# Patient Record
Sex: Male | Born: 2000
Health system: Southern US, Community
[De-identification: ages and names within clinical notes are randomized; demographics above are authoritative.]

## PROBLEM LIST (undated history)

## (undated) DIAGNOSIS — D571 Sickle-cell disease without crisis: Secondary | ICD-10-CM

---

## 2019-04-22 ENCOUNTER — Encounter (HOSPITAL_COMMUNITY): Payer: Self-pay | Admitting: General Practice

## 2019-04-22 ENCOUNTER — Non-Acute Institutional Stay (HOSPITAL_COMMUNITY)
Admission: AD | Admit: 2019-04-22 | Discharge: 2019-04-22 | Disposition: A | Discharge status: Home / Self Care | Payer: Self-pay | Source: Ambulatory Visit | Attending: Internal Medicine | Admitting: Internal Medicine

## 2019-04-22 ENCOUNTER — Telehealth (HOSPITAL_COMMUNITY): Payer: Self-pay | Admitting: *Deleted

## 2019-04-22 DIAGNOSIS — E860 Dehydration: Secondary | ICD-10-CM | POA: Insufficient documentation

## 2019-04-22 DIAGNOSIS — D5700 Hb-SS disease with crisis, unspecified: Secondary | ICD-10-CM | POA: Diagnosis present

## 2019-04-22 LAB — URINALYSIS, COMPLETE (UACMP) WITH MICROSCOPIC
Bacteria, UA: NONE SEEN
Bilirubin Urine: NEGATIVE
Glucose, UA: NEGATIVE mg/dL
Ketones, ur: 5 mg/dL — AB
Leukocytes,Ua: NEGATIVE
Nitrite: NEGATIVE
Protein, ur: NEGATIVE mg/dL
Specific Gravity, Urine: 1.004 — ABNORMAL LOW (ref 1.005–1.030)
pH: 6 (ref 5.0–8.0)

## 2019-04-22 LAB — CBC WITH DIFFERENTIAL/PLATELET
Abs Immature Granulocytes: 0.07 10*3/uL (ref 0.00–0.07)
Basophils Absolute: 0 10*3/uL (ref 0.0–0.1)
Basophils Relative: 0 %
Eosinophils Absolute: 0.1 10*3/uL (ref 0.0–0.5)
Eosinophils Relative: 1 %
HCT: 36.7 % — ABNORMAL LOW (ref 39.0–52.0)
Hemoglobin: 12.7 g/dL — ABNORMAL LOW (ref 13.0–17.0)
Immature Granulocytes: 1 %
Lymphocytes Relative: 6 %
Lymphs Abs: 0.9 10*3/uL (ref 0.7–4.0)
MCH: 26.1 pg (ref 26.0–34.0)
MCHC: 34.6 g/dL (ref 30.0–36.0)
MCV: 75.4 fL — ABNORMAL LOW (ref 80.0–100.0)
Monocytes Absolute: 1.8 10*3/uL — ABNORMAL HIGH (ref 0.1–1.0)
Monocytes Relative: 12 %
Neutro Abs: 12.7 10*3/uL — ABNORMAL HIGH (ref 1.7–7.7)
Neutrophils Relative %: 80 %
Platelets: 301 10*3/uL (ref 150–400)
RBC: 4.87 MIL/uL (ref 4.22–5.81)
RDW: 15 % (ref 11.5–15.5)
WBC: 15.6 10*3/uL — ABNORMAL HIGH (ref 4.0–10.5)
nRBC: 0.7 % — ABNORMAL HIGH (ref 0.0–0.2)

## 2019-04-22 LAB — COMPREHENSIVE METABOLIC PANEL
ALT: 13 U/L (ref 0–44)
AST: 22 U/L (ref 15–41)
Albumin: 4.7 g/dL (ref 3.5–5.0)
Alkaline Phosphatase: 71 U/L (ref 38–126)
Anion gap: 11 (ref 5–15)
BUN: 12 mg/dL (ref 6–20)
CO2: 22 mmol/L (ref 22–32)
Calcium: 9 mg/dL (ref 8.9–10.3)
Chloride: 100 mmol/L (ref 98–111)
Creatinine, Ser: 1.01 mg/dL (ref 0.61–1.24)
GFR calc Af Amer: >60 mL/min (ref >60)
GFR calc non Af Amer: >60 mL/min (ref >60)
Glucose, Bld: 86 mg/dL (ref 70–99)
Potassium: 3.7 mmol/L (ref 3.5–5.1)
Sodium: 133 mmol/L — ABNORMAL LOW (ref 135–145)
Total Bilirubin: 2.8 mg/dL — ABNORMAL HIGH (ref 0.3–1.2)
Total Protein: 7.6 g/dL (ref 6.5–8.1)

## 2019-04-22 LAB — RETICULOCYTES
Immature Retic Fract: 19.1 % — ABNORMAL HIGH (ref 2.3–15.9)
RBC.: 4.87 MIL/uL (ref 4.22–5.81)
Retic Count, Absolute: 160.2 10*3/uL (ref 19.0–186.0)
Retic Ct Pct: 3.3 % — ABNORMAL HIGH (ref 0.4–3.1)

## 2019-04-22 LAB — RAPID URINE DRUG SCREEN, HOSP PERFORMED
Amphetamines: NOT DETECTED
Barbiturates: NOT DETECTED
Benzodiazepines: NOT DETECTED
Cocaine: NOT DETECTED
Opiates: POSITIVE — AB
Tetrahydrocannabinol: POSITIVE — AB

## 2019-04-22 MED ORDER — KETOROLAC TROMETHAMINE 30 MG/ML IJ SOLN
15.0000 mg | Freq: Once | INTRAMUSCULAR | Status: AC
  Administered 2019-04-22: 15 mg via INTRAVENOUS
  Filled 2019-04-22: qty 1

## 2019-04-22 MED ORDER — HYDROMORPHONE HCL 1 MG/ML IJ SOLN
0.5000 mg | Freq: Once | INTRAMUSCULAR | Status: AC
  Administered 2019-04-22: 0.5 mg via INTRAVENOUS
  Filled 2019-04-22: qty 1

## 2019-04-22 MED ORDER — OXYCODONE HCL 5 MG PO TABS
5.0000 mg | ORAL_TABLET | Freq: Once | ORAL | Status: AC
  Administered 2019-04-22: 5 mg via ORAL
  Filled 2019-04-22: qty 1

## 2019-04-22 MED ORDER — SODIUM CHLORIDE 0.45 % IV BOLUS
500.0000 mL | Freq: Once | INTRAVENOUS | Status: AC
  Administered 2019-04-22: 500 mL via INTRAVENOUS

## 2019-04-22 MED ORDER — IBUPROFEN 800 MG PO TABS: 800.0000 mg | ORAL_TABLET | Freq: Three times a day (TID) | ORAL | 0 refills | Status: DC | PRN

## 2019-04-22 MED ORDER — ACETAMINOPHEN 500 MG PO TABS
1000.0000 mg | ORAL_TABLET | Freq: Once | ORAL | Status: AC
  Administered 2019-04-22: 1000 mg via ORAL
  Filled 2019-04-22: qty 2

## 2019-04-22 MED ORDER — ONDANSETRON HCL 4 MG/2ML IJ SOLN: 4.0000 mg | Freq: Once | INTRAMUSCULAR | Status: DC

## 2019-04-22 MED ORDER — FOLIC ACID 1 MG PO TABS: 1.0000 mg | ORAL_TABLET | Freq: Every day | ORAL | 11 refills | Status: DC

## 2019-04-22 MED ORDER — FOLIC ACID 1 MG PO TABS: 1.0000 mg | ORAL_TABLET | Freq: Every day | ORAL | Status: DC

## 2019-04-22 MED ORDER — DIPHENHYDRAMINE HCL 25 MG PO CAPS: 25.0000 mg | ORAL_CAPSULE | Freq: Once | ORAL | Status: DC

## 2019-04-22 NOTE — Telephone Encounter (Signed)
Provider, Genia Harold, from [facility] called requesting that patient come to the day hospital for pain management. Provider reports that patient is having sickle cell crisis in his left arm with pain rated 8/10. Reports that patient last took Tylenol with Codien at 6:30. COVID-19 screening done and negative. Denies fever, chest pain, nausea, vomiting, diarrhea, abdominal pain and priapism. Patient has transportation at discharge without driving self. [place], [place] notified and spoke with [place]. Patient can come to the day hospital for pain management. Provider and patient advised.

## 2019-04-22 NOTE — Discharge Instructions (Signed)
You will discharge with folic acid 1 mg daily for bone marrow support.  We discussed the importance of drinking 64 ounces of water daily.  Water to help prevent pain crises, is important to drink throughout today.  Dehydration of red blood cells can lead to rapid sickling. Sickle cell is primarily inflammatory.  I recommend that you continue ibuprofen 800 mg every 8 hours for mild to moderate pain.  Also, Tylenol 500 mg every 6 hours as needed.  Use interchangeably with prescribed pain medications.  I recommend that you follow-up with primary care provider. You will need maintenance of your sickle cell disease to prevent acute crisis.    Sickle Cell Anemia, Adult  Sickle cell anemia is a condition where your red blood cells are shaped like sickles. Red blood cells carry oxygen through the body. Sickle-shaped cells do not live as long as normal red blood cells. They also clump together and block blood from flowing through the blood vessels. This prevents the body from getting enough oxygen. Sickle cell anemia causes organ damage and pain. It also increases the risk of infection. Follow these instructions at home: Medicines  Take over-the-counter and prescription medicines only as told by your doctor.  If you were prescribed an antibiotic medicine, take it as told by your doctor. Do not stop taking the antibiotic even if you start to feel better.  If you develop a fever, do not take medicines to lower the fever right away. Tell your doctor about the fever. Managing pain, stiffness, and swelling  Try these methods to help with pain: ? Use a heating pad. ? Take a warm bath. ? Distract yourself, such as by watching TV. Eating and drinking  Drink enough fluid to keep your pee (urine) clear or pale yellow. Drink more in hot weather and during exercise.  Limit or avoid alcohol.  Eat a healthy diet. Eat plenty of fruits, vegetables, whole grains, and lean protein.  Take vitamins and supplements  as told by your doctor. Traveling  When traveling, keep these with you: ? Your medical information. ? The names of your doctors. ? Your medicines.  If you need to take an airplane, talk to your doctor first. Activity  Rest often.  Avoid exercises that make your heart beat much faster, such as jogging. General instructions  Do not use products that have nicotine or tobacco, such as cigarettes and e-cigarettes. If you need help quitting, ask your doctor.  Consider wearing a medical alert bracelet.  Avoid being in high places (high altitudes), such as mountains.  Avoid very hot or cold temperatures.  Avoid places where the temperature changes a lot.  Keep all follow-up visits as told by your doctor. This is important. Contact a doctor if:  A joint hurts.  Your feet or hands hurt or swell.  You feel tired (fatigued). Get help right away if:  You have symptoms of infection. These include: ? Fever. ? Chills. ? Being very tired. ? Irritability. ? Poor eating. ? Throwing up (vomiting).  You feel dizzy or faint.  You have new stomach pain, especially on the left side.  You have a an erection (priapism) that lasts more than 4 hours.  You have numbness in your arms or legs.  You have a hard time moving your arms or legs.  You have trouble talking.  You have pain that does not go away when you take medicine.  You are short of breath.  You are breathing fast.  You have  a long-term cough.  You have pain in your chest.  You have a bad headache.  You have a stiff neck.  Your stomach looks bloated even though you did not eat much.  Your skin is pale.  You suddenly cannot see well. Summary  Sickle cell anemia is a condition where your red blood cells are shaped like sickles.  Follow your doctor's advice on ways to manage pain, food to eat, activities to do, and steps to take for safe travel.  Get medical help right away if you have any signs of  infection, such as a fever. This information is not intended to replace advice given to you by your health care provider. Make sure you discuss any questions you have with your health care provider. Document Released: 05/05/2013 Document Revised: 11/06/2018 Document Reviewed: 08/20/2016 Elsevier Patient Education  2020 [employer].

## 2019-04-22 NOTE — Progress Notes (Signed)
Patient admitted to the day hospital for treatment of sickle cell pain crisis. Patient reported pain rated 8/10 in the left arm . Patient given IV  Dilaudid, PO Tylenol, IV Toradol and hydrated with IV fluids. At discharge patient reported pain at 3/10. Discharge instructions given to patient. Patient alert, oriented and ambulatory at discharge.

## 2019-04-22 NOTE — H&P (Signed)
[facility] History and Physical   Date: 04/22/2019  Patient name: Charles Mcgee Medical record number: [id number] Date of birth: 01/26/2001 Age: 18 y.o. Gender: male PCP: Quentin Angst, MD Would not do that when Attending physician: Quentin Angst, MD  Chief Complaint: Sickle cell pain  History of Present Illness: Gadge Hermiz, a 18 year old male with a medical history significant for sickle cell disease presents complaining of pain to left chest, left shoulder, left scapula, and left upper extremity.  Patient states that pain started suddenly 2 days ago.  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  He says that he typically does not have pain related to sickle cell disease.  When he does have pain, is usually managed with Percocet and/or ibuprofen.  Current pain intensity is 8/10 characterized as constant, throbbing, and occasionally sharp.  He attributes current pain crisis to dehydration and changes in weather.  He denies fever, chills, sore throat, persistent cough, chest pain, or shortness of breath.  No recent travel, no sick contacts, no exposure to COVID-19.  He also denies headache, dysuria, nausea, vomiting, or diarrhea  Meds: No medications prior to admission.    Allergies: Patient has no allergy information on record. No past medical history on file.  No family history on file. Social History   Socioeconomic History  . Marital status: Single    Spouse name: Not on file  . Number of children: Not on file  . Years of education: Not on file  . Highest education level: Not on file  Occupational History  . Not on file  Social Needs  . Financial resource strain: Not on file  . Food insecurity    Worry: Not on file    Inability: Not on file  . Transportation needs    Medical: Not on file    Non-medical: Not on file  Tobacco Use  . Smoking status: Not on file  Substance and Sexual Activity  . Alcohol use: Not on file  . Drug  use: Not on file  . Sexual activity: Not on file  Lifestyle  . Physical activity    Days per week: Not on file    Minutes per session: Not on file  . Stress: Not on file  Relationships  . Social Musician on phone: Not on file    Gets together: Not on file    Attends religious service: Not on file    Active member of club or organization: Not on file    Attends meetings of clubs or organizations: Not on file    Relationship status: Not on file  . Intimate partner violence    Fear of current or ex partner: Not on file    Emotionally abused: Not on file    Physically abused: Not on file    Forced sexual activity: Not on file  Other Topics Concern  . Not on file  Social History Narrative  . Not on file   Review of Systems  Constitutional: Negative.  Negative for chills and fever.  HENT: Negative.   Respiratory: Negative.   Cardiovascular: Negative.   Gastrointestinal: Negative.   Genitourinary: Negative.  Negative for dysuria and urgency.  Musculoskeletal: Positive for joint pain.  Skin: Negative.   Neurological: Negative.   Endo/Heme/Allergies: Negative.   Psychiatric/Behavioral: Negative.  Negative for depression. The patient is not nervous/anxious.     Physical Exam: Blood pressure (!) 144/78, pulse 72, temperature 99.6 F (37.6  C), temperature source Oral, height 6' (1.829 m), weight 167 lb (75.8 kg), SpO2 100 %. BP (!) 144/78 (BP Location: Right Arm)   Pulse 72   Temp 99.6 F (37.6 C) (Oral)   Ht 6' (1.829 m)   Wt 167 lb (75.8 kg)   SpO2 100%   BMI 22.65 kg/m   General Appearance:    Alert, cooperative, no distress, appears stated age  Head:    Normocephalic, without obvious abnormality, atraumatic  Eyes:    PERRL, conjunctiva/corneas clear, EOM's intact, fundi    benign, both eyes.  Scleral icterus.       Ears:    Normal TM's and external ear canals, both ears  Nose:   Nares normal, septum midline, mucosa normal, no drainage   or sinus  tenderness  Throat:   Lips, mucosa, and tongue normal; teeth and gums normal  Neck:   Supple, symmetrical, trachea midline, no adenopathy;       thyroid:  No enlargement/tenderness/nodules; no carotid   bruit or JVD  Back:     Symmetric, no curvature, ROM normal, no CVA tenderness  Lungs:     Clear to auscultation bilaterally, respirations unlabored  Chest wall:    No tenderness or deformity  Heart:    Regular rate and rhythm, S1 and S2 normal, no murmur, rub   or gallop  Abdomen:     Soft, non-tender, bowel sounds active all four quadrants,    no masses, no organomegaly  Extremities:   Extremities normal, atraumatic, no cyanosis or edema  Pulses:   2+ and symmetric all extremities  Skin:   Skin color, texture, turgor normal, no rashes or lesions  Lymph nodes:   Cervical, supraclavicular, and axillary nodes normal  Neurologic:   CNII-XII intact. Normal strength, sensation and reflexes      throughout    Lab results: No results found for this or any previous visit (from the past 24 hour(s)).  Imaging results:  No results found.   Assessment & Plan: Patient admitted to sickle cell day infusion clinic for management of pain crisis.  Patient is opiate nave. He is dehydrated and has very poor venous access. Patient warrants 0.45% saline fluid bolus, 500 mL over 1 hour for cellular rehydration. Dilaudid 0.5 mg IV x1 dose Oxycodone 5 mg IR by mouth x1 dose Toradol 15 mg IV x1 dose Tylenol 1000 mg by mouth x1 dose Zofran 4 mg IV x1 dose 25 mg by mouth x1 dose Pain will be reevaluated in context of functioning and patient should baseline as his care progresses If pain intensity remains elevated, transition to inpatient services for higher level of care  [place], MSN, FNP-C [facility] [address] [place], [place] [address] [phone]   04/22/2019, 3:27 PM

## 2019-04-22 NOTE — Discharge Summary (Signed)
[facility] Discharge Summary   Patient ID: Charles Mcgee MRN: [id number] DOB/AGE: [birth year] 18 y.o.  Admit date: 04/22/2019 Discharge date: 04/22/2019  Primary Care Physician:  Quentin Angst, MD  Admission Diagnoses:  Active Problems:   Sickle cell crisis [facility])   Discharge Diagnoses:   Sickle cell disease  Discharge Medications:  Allergies as of 04/22/2019   Not on File     Medication List    TAKE these medications   folic acid 1 MG tablet Commonly known as: FOLVITE Take 1 tablet (1 mg total) by mouth daily.   ibuprofen 800 MG tablet Commonly known as: ADVIL Take 1 tablet (800 mg total) by mouth every 8 (eight) hours as needed.        Consults:  None  Significant Diagnostic Studies:  No results found.   [facility] Course: Patient admitted to sickle cell day infusion center for management of pain crisis. Reviewed all laboratory values. WBCs 15.6, patient afebrile, no signs of infection or inflammation.  Suspected to be reactive. Hemoglobin 12.7.  Unsure of patient's baseline.  He has not been to a hematologist or PCP over the past several years. Pain managed with Dilaudid 0.5 mg IV x1 dose Oxycodone 5 mg by mouth x1 dose Tylenol 1000 mg by mouth x1 dose Toradol 15 mg IV x1 dose IV fluids, 500 mL 0.45% bolus Pain intensity decreased to 3/10.  Patient does not warrant admission at this time.  He was advised to return in a.m. if pain persists.  Ibuprofen 800 mg every 8 hours for mild to moderate pain.  Also, sent folic acid 1 mg daily for bone marrow support. Patient has opiate pain medications that were previously prescribed. He is alert, oriented, and ambulating without assistance. Patient will discharge home in a hemodynamically stable condition.  Discharge instructions: Resume all home medications.  Establish care with a primary care provider  Discussed the importance of drinking 64 ounces of water daily to  help  prevent pain crises, it is important to drink plenty of water throughout the day. This is because dehydration of red blood cells may lead further sickling.   Avoid all stressors that precipitate sickle cell pain crisis.     The patient was given clear instructions to go to ER or return to medical center if symptoms do not improve, worsen or new problems develop.    Physical Exam at Discharge:  BP 125/67 (BP Location: Right Arm)   Pulse 62   Temp 99.6 F (37.6 C) (Oral)   Resp 18   Ht 6' (1.829 m)   Wt 167 lb (75.8 kg)   SpO2 100%   BMI 22.65 kg/m  Physical Exam Constitutional:      Appearance: Normal appearance.  HENT:     Head: Normocephalic.  Eyes:     Pupils: Pupils are equal, round, and reactive to light.  Cardiovascular:     Rate and Rhythm: Normal rate and regular rhythm.  Pulmonary:     Effort: Pulmonary effort is normal.     Breath sounds: Normal breath sounds.  Abdominal:     General: Abdomen is flat. Bowel sounds are normal.  Musculoskeletal: Normal range of motion.  Neurological:     General: No focal deficit present.     Mental Status: He is alert. Mental status is at baseline.  Psychiatric:        Mood and Affect: Mood normal.        Behavior: Behavior normal.  Thought Content: Thought content normal.        Judgment: Judgment normal.      Disposition at Discharge: Discharge disposition: 01-Home or Self Care       Discharge Orders: Discharge Instructions    Discharge patient   Complete by: As directed    Discharge disposition: 01-Home or Self Care   Discharge patient date: 04/22/2019      Condition at Discharge:   Stable  Time spent on Discharge:  Greater than 30 minutes.  Signed: Donia Pounds  APRN, MSN, FNP-C Patient [facility] [address] [place], [place] [address] [phone]  04/22/2019, 4:53 PM

## 2019-04-23 ENCOUNTER — Encounter (HOSPITAL_COMMUNITY): Payer: Self-pay | Admitting: *Deleted

## 2019-04-23 ENCOUNTER — Non-Acute Institutional Stay (HOSPITAL_COMMUNITY)
Admission: AD | Admit: 2019-04-23 | Discharge: 2019-04-23 | Disposition: A | Discharge status: Home / Self Care | Payer: Self-pay | Source: Ambulatory Visit | Attending: Internal Medicine | Admitting: Internal Medicine

## 2019-04-23 ENCOUNTER — Telehealth (HOSPITAL_COMMUNITY): Payer: Self-pay | Admitting: General Practice

## 2019-04-23 DIAGNOSIS — Z79899 Other long term (current) drug therapy: Secondary | ICD-10-CM | POA: Insufficient documentation

## 2019-04-23 DIAGNOSIS — D5700 Hb-SS disease with crisis, unspecified: Secondary | ICD-10-CM | POA: Diagnosis present

## 2019-04-23 DIAGNOSIS — M25512 Pain in left shoulder: Secondary | ICD-10-CM | POA: Insufficient documentation

## 2019-04-23 DIAGNOSIS — K5900 Constipation, unspecified: Secondary | ICD-10-CM | POA: Insufficient documentation

## 2019-04-23 DIAGNOSIS — R079 Chest pain, unspecified: Secondary | ICD-10-CM | POA: Insufficient documentation

## 2019-04-23 DIAGNOSIS — M546 Pain in thoracic spine: Secondary | ICD-10-CM | POA: Insufficient documentation

## 2019-04-23 LAB — CBC
HCT: 36.8 % — ABNORMAL LOW (ref 39.0–52.0)
Hemoglobin: 12.9 g/dL — ABNORMAL LOW (ref 13.0–17.0)
MCH: 26.2 pg (ref 26.0–34.0)
MCHC: 35.1 g/dL (ref 30.0–36.0)
MCV: 74.8 fL — ABNORMAL LOW (ref 80.0–100.0)
Platelets: 254 10*3/uL (ref 150–400)
RBC: 4.92 MIL/uL (ref 4.22–5.81)
RDW: 14.8 % (ref 11.5–15.5)
WBC: 17.1 10*3/uL — ABNORMAL HIGH (ref 4.0–10.5)
nRBC: 0.5 % — ABNORMAL HIGH (ref 0.0–0.2)

## 2019-04-23 MED ORDER — DIPHENHYDRAMINE HCL 25 MG PO CAPS: 25.0000 mg | ORAL_CAPSULE | Freq: Four times a day (QID) | ORAL | Status: DC | PRN

## 2019-04-23 MED ORDER — ACETAMINOPHEN 500 MG PO TABS
1000.0000 mg | ORAL_TABLET | Freq: Once | ORAL | Status: AC
  Administered 2019-04-23: 1000 mg via ORAL
  Filled 2019-04-23: qty 2

## 2019-04-23 MED ORDER — ONDANSETRON HCL 4 MG/2ML IJ SOLN: 4.0000 mg | Freq: Four times a day (QID) | INTRAMUSCULAR | Status: DC | PRN

## 2019-04-23 MED ORDER — OXYCODONE HCL 5 MG PO TABS
5.0000 mg | ORAL_TABLET | Freq: Once | ORAL | Status: AC
  Administered 2019-04-23: 5 mg via ORAL
  Filled 2019-04-23: qty 1

## 2019-04-23 MED ORDER — KETOROLAC TROMETHAMINE 30 MG/ML IJ SOLN
15.0000 mg | Freq: Once | INTRAMUSCULAR | Status: AC
  Administered 2019-04-23: 15 mg via INTRAVENOUS
  Filled 2019-04-23: qty 1

## 2019-04-23 MED ORDER — SODIUM CHLORIDE 0.45 % IV SOLN
INTRAVENOUS | Status: DC
  Administered 2019-04-23: 13:00:00 via INTRAVENOUS

## 2019-04-23 NOTE — Discharge Summary (Signed)
[facility] Discharge Summary   Patient ID: Charles Mcgee MRN: [id number] DOB/AGE: October 24, 2000 18 y.o.  Admit date: 04/23/2019 Discharge date: 04/23/2019  Primary Care Physician:  Quentin Angst, MD  Admission Diagnoses:  Active Problems:   Sickle cell crisis [facility])    Discharge Medications:  Allergies as of 04/23/2019   Not on File     Medication List    TAKE these medications   folic acid 1 MG tablet Commonly known as: FOLVITE Take 1 tablet (1 mg total) by mouth daily.   ibuprofen 800 MG tablet Commonly known as: ADVIL Take 1 tablet (800 mg total) by mouth every 8 (eight) hours as needed.        Consults:  None  Significant Diagnostic Studies:  No results found.  History of present illness: Charles Mcgee, 18 year old male with a medical history significant for sickle cell disease presents complaining of pain primarily to left chest, left shoulder, and left upper back.  Patient states the pain intensity initially increased several days ago.  He attributes pain crisis to changes in weather.  Patient was treated for this problem and the sickle cell day infusion center on 04/22/2019 without complete resolution.  He states that he took hydromorphone and ibuprofen on last night without sustained relief.  Pain intensity is 7/10 characterized as constant, throbbing, and occasionally sharp.  He currently denies fever, chills, sore throat, persistent cough, chest pain, or shortness of breath.  No recent travel, sick contacts, or exposure to COVID-19.  He also denies headache, dysuria, nausea, vomiting, or diarrhea.  He endorses constipation and states that he took MiraLAX on last night. [facility] Course: Patient admitted to sickle cell day infusion center for management of pain crisis. Patient was treated and evaluated for this problem on 04/22/2019.  At that time, WBCs were elevated and continues to be elevated on today.  Patient remains afebrile  and there are no signs of infection or inflammation.  Suspected to be reactive due to sickle cell disease.  Patient does not warrant antibiotics. Pain managed with IV fluids, 0.45% saline at 125 mL/h IV Toradol 15 mg x 1  Tylenol 1000 mg by mouth x1 Oxycodone IR 5 mg by mouth x1 dose Patient's pain intensity decreased to 3/10.  He does not warrant admission at this time.  Patient advised to establish care with PCP for sickle cell and medication management.  He expressed understanding.  Patient is alert, oriented, and ambulating without assistance. He was discharged home in a hemodynamically stable condition.  Discharge instructions: Resume all home medications.  Establish care with PCP as discussed  Discussed the importance of drinking 64 ounces of water daily to  help prevent pain crises, it is important to drink plenty of water throughout the day. This is because dehydration of red blood cells may lead further sickling.   Avoid all stressors that precipitate sickle cell pain crisis.     The patient was given clear instructions to go to ER or return to medical center if symptoms do not improve, worsen or new problems develop.     Physical Exam at Discharge:  BP 135/76 (BP Location: Right Arm)   Pulse 64   Temp 98.3 F (36.8 C) (Oral)   Resp 16   SpO2 100%  Physical Exam Constitutional:      Appearance: Normal appearance.  Eyes:     Pupils: Pupils are equal, round, and reactive to light.  Cardiovascular:     Rate and  Rhythm: Normal rate and regular rhythm.  Pulmonary:     Effort: Pulmonary effort is normal.  Abdominal:     General: Abdomen is flat. Bowel sounds are normal.  Musculoskeletal: Normal range of motion.  Skin:    General: Skin is warm.  Neurological:     General: No focal deficit present.     Mental Status: He is alert.  Psychiatric:        Mood and Affect: Mood normal.        Behavior: Behavior normal.        Thought Content: Thought content normal.         Judgment: Judgment normal.       Disposition at Discharge: There are no questions and answers to display.        Discharge Orders:   Condition at Discharge:   Stable  Time spent on Discharge:  Greater than 30 minutes.  Signed: Donia Pounds  APRN, MSN, FNP-C Patient [facility] [address] [place], [place] [address] [phone]  04/23/2019, 4:36 PM

## 2019-04-23 NOTE — Telephone Encounter (Signed)
Patient called, complained of pain in the left arm rated at  6/10. Denied chest pain, fever, diarrhea, abdominal pain, nausea/vomitting. Screened negative for Covid-19 symptoms. Admitted to having means of transportation without driving self after treatment. Last took 600 mg of Ibuprofen at 03:00 today and Hydrocodone at 21:00 yesterday. Per provider, patient can come to the day hospital for treatment. Patient notified, verbalized understanding.

## 2019-04-23 NOTE — Progress Notes (Signed)
Patient admitted to the day infusion hospital for sickle cell pain. Initially, patient reported left arm pain rated 7/10. For pain management, patient given 15 mg IV Toradol, 1000 mg Tylenol, 5 mg Oxycodone and hydrated with IV fluids. K-pad also used on arm for pain relief. At discharge, patient rated pain at 3/10. Vital signs stable. Discharge instructions given. Patient alert, oriented and ambulatory at discharge.

## 2019-04-23 NOTE — Discharge Instructions (Signed)
Sickle Cell Anemia, Adult ° °Sickle cell anemia is a condition where your red blood cells are shaped like sickles. Red blood cells carry oxygen through the body. Sickle-shaped cells do not live as long as normal red blood cells. They also clump together and block blood from flowing through the blood vessels. This prevents the body from getting enough oxygen. Sickle cell anemia causes organ damage and pain. It also increases the risk of infection. °Follow these instructions at home: °Medicines °· Take over-the-counter and prescription medicines only as told by your doctor. °· If you were prescribed an antibiotic medicine, take it as told by your doctor. Do not stop taking the antibiotic even if you start to feel better. °· If you develop a fever, do not take medicines to lower the fever right away. Tell your doctor about the fever. °Managing pain, stiffness, and swelling °· Try these methods to help with pain: °? Use a heating pad. °? Take a warm bath. °? Distract yourself, such as by watching TV. °Eating and drinking °· Drink enough fluid to keep your pee (urine) clear or pale yellow. Drink more in hot weather and during exercise. °· Limit or avoid alcohol. °· Eat a healthy diet. Eat plenty of fruits, vegetables, whole grains, and lean protein. °· Take vitamins and supplements as told by your doctor. °Traveling °· When traveling, keep these with you: °? Your medical information. °? The names of your doctors. °? Your medicines. °· If you need to take an airplane, talk to your doctor first. °Activity °· Rest often. °· Avoid exercises that make your heart beat much faster, such as jogging. °General instructions °· Do not use products that have nicotine or tobacco, such as cigarettes and e-cigarettes. If you need help quitting, ask your doctor. °· Consider wearing a medical alert bracelet. °· Avoid being in high places (high altitudes), such as mountains. °· Avoid very hot or cold temperatures. °· Avoid places where the  temperature changes a lot. °· Keep all follow-up visits as told by your doctor. This is important. °Contact a doctor if: °· A joint hurts. °· Your feet or hands hurt or swell. °· You feel tired (fatigued). °Get help right away if: °· You have symptoms of infection. These include: °? Fever. °? Chills. °? Being very tired. °? Irritability. °? Poor eating. °? Throwing up (vomiting). °· You feel dizzy or faint. °· You have new stomach pain, especially on the left side. °· You have a an erection (priapism) that lasts more than 4 hours. °· You have numbness in your arms or legs. °· You have a hard time moving your arms or legs. °· You have trouble talking. °· You have pain that does not go away when you take medicine. °· You are short of breath. °· You are breathing fast. °· You have a long-term cough. °· You have pain in your chest. °· You have a bad headache. °· You have a stiff neck. °· Your stomach looks bloated even though you did not eat much. °· Your skin is pale. °· You suddenly cannot see well. °Summary °· Sickle cell anemia is a condition where your red blood cells are shaped like sickles. °· Follow your doctor's advice on ways to manage pain, food to eat, activities to do, and steps to take for safe travel. °· Get medical help right away if you have any signs of infection, such as a fever. °This information is not intended to replace advice given to you by   your health care provider. Make sure you discuss any questions you have with your health care provider. °Document Released: 05/05/2013 Document Revised: 11/06/2018 Document Reviewed: 08/20/2016 °Elsevier Patient Education © 2020 [employer] ° °

## 2019-04-23 NOTE — H&P (Addendum)
[facility] History and Physical   Date: 04/23/2019  Patient name: Charles Mcgee Medical record number: [id number] Date of birth: 05-May-2001 Age: 18 y.o. Gender: male PCP: Quentin Angst, MD  Attending physician: Quentin Angst, MD  Chief Complaint: Sickle cell pain  History of Present Illness: Charles Mcgee, a 18 year old male with a medical history significant for sickle cell disease presents complaining of pain primarily to left chest, left shoulder, and left upper back.  Patient states that pain intensity increased several days ago.  He attributes it to changes in weather.  Patient was treated for this problem in the sickle cell day infusion center on 04/22/2019 without complete resolution.  He states that he took hydromorphone and ibuprofen on last night without sustained relief.  Current pain intensity is 7/10 characterized as constant, throbbing, and occasionally sharp.  He currently denies fever, chills, sore throat, persistent cough, chest pain, or shortness of breath.  No recent travel, sick contacts, or exposure to COVID-19.  He also denies headache, dysuria, nausea, vomiting, or diarrhea.  He endorses constipation and states that he took MiraLAX on last night.  Meds: Medications Prior to Admission  Medication Sig Dispense Refill Last Dose  . folic acid (FOLVITE) 1 MG tablet Take 1 tablet (1 mg total) by mouth daily. 30 tablet 11   . ibuprofen (ADVIL) 800 MG tablet Take 1 tablet (800 mg total) by mouth every 8 (eight) hours as needed. 30 tablet 0     Allergies: Patient has no allergy information on record. No past medical history on file. No past surgical history on file. No family history on file. Social History   Socioeconomic History  . Marital status: Single    Spouse name: Not on file  . Number of children: Not on file  . Years of education: Not on file  . Highest education level: Not on file  Occupational History  . Not on file  Social  Needs  . Financial resource strain: Not on file  . Food insecurity    Worry: Not on file    Inability: Not on file  . Transportation needs    Medical: Not on file    Non-medical: Not on file  Tobacco Use  . Smoking status: Never Smoker  . Smokeless tobacco: Never Used  Substance and Sexual Activity  . Alcohol use: Not on file  . Drug use: Not on file  . Sexual activity: Not on file  Lifestyle  . Physical activity    Days per week: Not on file    Minutes per session: Not on file  . Stress: Not on file  Relationships  . Social Musician on phone: Not on file    Gets together: Not on file    Attends religious service: Not on file    Active member of club or organization: Not on file    Attends meetings of clubs or organizations: Not on file    Relationship status: Not on file  . Intimate partner violence    Fear of current or ex partner: Not on file    Emotionally abused: Not on file    Physically abused: Not on file    Forced sexual activity: Not on file  Other Topics Concern  . Not on file  Social History Narrative  . Not on file   Review of Systems  Constitutional: Negative for chills and fever.  HENT: Negative for hearing loss and tinnitus.   Respiratory: Negative  for cough and hemoptysis.   Gastrointestinal: Negative.   Genitourinary: Negative.   Musculoskeletal: Positive for back pain and joint pain.  Skin: Negative.   Neurological: Negative.   Endo/Heme/Allergies: Negative.   Psychiatric/Behavioral: Negative.     Physical Exam: There were no vitals taken for this visit. BP 135/76 (BP Location: Right Arm)   Pulse 64   Temp 98.3 F (36.8 C) (Oral)   Resp 16   SpO2 100%   General Appearance:    Alert, cooperative, no distress, appears stated age  Head:    Normocephalic, without obvious abnormality, atraumatic  Eyes:    PERRL, conjunctiva/corneas clear, EOM's intact, fundi    benign, both eyes       Ears:    Normal TM's and external ear  canals, both ears  Nose:   Nares normal, septum midline, mucosa normal, no drainage   or sinus tenderness  Throat:   Lips, mucosa, and tongue normal; teeth and gums normal  Neck:   Supple, symmetrical, trachea midline, no adenopathy;       thyroid:  No enlargement/tenderness/nodules; no carotid   bruit or JVD  Back:     Symmetric, no curvature, ROM normal, no CVA tenderness  Lungs:     Clear to auscultation bilaterally, respirations unlabored  Chest wall:    No tenderness or deformity  Heart:    Regular rate and rhythm, S1 and S2 normal, no murmur, rub   or gallop  Abdomen:     Soft, non-tender, bowel sounds active all four quadrants,    no masses, no organomegaly  Extremities:   Extremities normal, atraumatic, no cyanosis or edema  Pulses:   2+ and symmetric all extremities  Skin:   Skin color, texture, turgor normal, no rashes or lesions  Lymph nodes:   Cervical, supraclavicular, and axillary nodes normal  Neurologic:   CNII-XII intact. Normal strength, sensation and reflexes      throughout    Lab results: Results for orders placed or performed during the hospital encounter of 04/22/19 (from the past 24 hour(s))  CBC with Differential/Platelet     Status: Abnormal   Collection Time: 04/22/19  3:30 PM  Result Value Ref Range   WBC 15.6 (H) 4.0 - 10.5 K/uL   RBC 4.87 4.22 - 5.81 MIL/uL   Hemoglobin 12.7 (L) 13.0 - 17.0 g/dL   HCT 36.7 (L) 39.0 - 52.0 %   MCV 75.4 (L) 80.0 - 100.0 fL   MCH 26.1 26.0 - 34.0 pg   MCHC 34.6 30.0 - 36.0 g/dL   RDW 15.0 11.5 - 15.5 %   Platelets 301 150 - 400 K/uL   nRBC 0.7 (H) 0.0 - 0.2 %   Neutrophils Relative % 80 %   Neutro Abs 12.7 (H) 1.7 - 7.7 K/uL   Lymphocytes Relative 6 %   Lymphs Abs 0.9 0.7 - 4.0 K/uL   Monocytes Relative 12 %   Monocytes Absolute 1.8 (H) 0.1 - 1.0 K/uL   Eosinophils Relative 1 %   Eosinophils Absolute 0.1 0.0 - 0.5 K/uL   Basophils Relative 0 %   Basophils Absolute 0.0 0.0 - 0.1 K/uL   Immature Granulocytes 1 %    Abs Immature Granulocytes 0.07 0.00 - 0.07 K/uL  Comprehensive metabolic panel     Status: Abnormal   Collection Time: 04/22/19  3:30 PM  Result Value Ref Range   Sodium 133 (L) 135 - 145 mmol/L   Potassium 3.7 3.5 - 5.1 mmol/L   Chloride  100 98 - 111 mmol/L   CO2 22 22 - 32 mmol/L   Glucose, Bld 86 70 - 99 mg/dL   BUN 12 6 - 20 mg/dL   Creatinine, Ser 1.611.01 0.61 - 1.24 mg/dL   Calcium 9.0 8.9 - 09.610.3 mg/dL   Total Protein 7.6 6.5 - 8.1 g/dL   Albumin 4.7 3.5 - 5.0 g/dL   AST 22 15 - 41 U/L   ALT 13 0 - 44 U/L   Alkaline Phosphatase 71 38 - 126 U/L   Total Bilirubin 2.8 (H) 0.3 - 1.2 mg/dL   GFR calc non Af Amer >60 >60 mL/min   GFR calc Af Amer >60 >60 mL/min   Anion gap 11 5 - 15  Reticulocytes     Status: Abnormal   Collection Time: 04/22/19  3:30 PM  Result Value Ref Range   Retic Ct Pct 3.3 (H) 0.4 - 3.1 %   RBC. 4.87 4.22 - 5.81 MIL/uL   Retic Count, Absolute 160.2 19.0 - 186.0 K/uL   Immature Retic Fract 19.1 (H) 2.3 - 15.9 %  Rapid urine drug screen (hospital performed)     Status: Abnormal   Collection Time: 04/22/19  3:50 PM  Result Value Ref Range   Opiates POSITIVE (A) NONE DETECTED   Cocaine NONE DETECTED NONE DETECTED   Benzodiazepines NONE DETECTED NONE DETECTED   Amphetamines NONE DETECTED NONE DETECTED   Tetrahydrocannabinol POSITIVE (A) NONE DETECTED   Barbiturates NONE DETECTED NONE DETECTED  Urinalysis, Complete w Microscopic     Status: Abnormal   Collection Time: 04/22/19  3:50 PM  Result Value Ref Range   Color, Urine STRAW (A) YELLOW   APPearance CLEAR CLEAR   Specific Gravity, Urine 1.004 (L) 1.005 - 1.030   pH 6.0 5.0 - 8.0   Glucose, UA NEGATIVE NEGATIVE mg/dL   Hgb urine dipstick SMALL (A) NEGATIVE   Bilirubin Urine NEGATIVE NEGATIVE   Ketones, ur 5 (A) NEGATIVE mg/dL   Protein, ur NEGATIVE NEGATIVE mg/dL   Nitrite NEGATIVE NEGATIVE   Leukocytes,Ua NEGATIVE NEGATIVE   WBC, UA 0-5 0 - 5 WBC/hpf   Bacteria, UA NONE SEEN NONE SEEN     Imaging results:  No results found.   Assessment & Plan: Patient admitted to sickle cell day infusion center for management of pain crisis.  Patient mostly opiate nave. IV fluids, 0.45% saline at 125 mL/h Tylenol 1000 mg x 1 Benadryl 25 mg x 1 Toradol 15 mg IV x1 Zofran 4 mg IV x1 Oxycodone 5 mg p.o. x1 dose WBCs were elevated on 11/20/2018, repeat CBC.  All other laboratory values unremarkable and do not warrant repeating on today. Pain intensity will be reevaluated in context of functioning and relationship to baseline as his care progresses. If pain intensity remains elevated, consider transitioning to inpatient for higher level of care. Nolon NationsLachina Moore Hollis  APRN, MSN, FNP-C Patient Care [facility] [address]  [place], [place] [id number] [phone]  04/23/2019, 12:25 PM

## 2019-10-14 ENCOUNTER — Ambulatory Visit: Payer: Self-pay | Attending: Internal Medicine

## 2019-10-21 ENCOUNTER — Ambulatory Visit: Payer: Self-pay | Attending: Family

## 2019-10-21 DIAGNOSIS — Z23 Encounter for immunization: Secondary | ICD-10-CM

## 2019-10-21 NOTE — Progress Notes (Signed)
   Covid-19 Vaccination Clinic  Name:  Charles Mcgee    MRN: [id number] DOB: 05/27/2001  10/21/2019  Charles Mcgee was observed post Covid-19 immunization for 15 minutes without incident. He was provided with Vaccine Information Sheet and instruction to access the V-Safe system.   Charles Mcgee was instructed to call 911 with any severe reactions post vaccine: Marland Kitchen Difficulty breathing  . Swelling of face and throat  . A fast heartbeat  . A bad rash all over body  . Dizziness and weakness   Immunizations Administered    Name Date Dose VIS Date Route   Moderna COVID-19 Vaccine 10/21/2019 11:26 AM 0.5 mL 06/29/2019 Intramuscular   Manufacturer: Moderna   Lot: [id number]   NDC: 78478-412-82

## 2019-11-23 ENCOUNTER — Ambulatory Visit: Payer: Self-pay

## 2019-11-23 ENCOUNTER — Ambulatory Visit: Payer: Self-pay | Attending: Family

## 2019-11-23 DIAGNOSIS — Z23 Encounter for immunization: Secondary | ICD-10-CM

## 2019-11-23 NOTE — Progress Notes (Signed)
   Covid-19 Vaccination Clinic  Name:  Charles Mcgee    MRN: [id number] DOB: April 29, 2001  11/23/2019  Mr. Charles Mcgee was observed post Covid-19 immunization for 15 minutes without incident. He was provided with Vaccine Information Sheet and instruction to access the V-Safe system.   Mr. Charles Mcgee was instructed to call 911 with any severe reactions post vaccine: Marland Kitchen Difficulty breathing  . Swelling of face and throat  . A fast heartbeat  . A bad rash all over body  . Dizziness and weakness   Immunizations Administered    Name Date Dose VIS Date Route   Moderna COVID-19 Vaccine 11/23/2019  4:24 PM 0.5 mL 06/2019 Intramuscular   Manufacturer: Moderna   Lot: [id number]   NDC: 07218-288-33

## 2019-11-25 ENCOUNTER — Ambulatory Visit: Payer: Self-pay

## 2020-01-12 ENCOUNTER — Encounter (HOSPITAL_COMMUNITY): Payer: Self-pay

## 2020-01-12 ENCOUNTER — Emergency Department (HOSPITAL_COMMUNITY)
Admission: EM | Admit: 2020-01-12 | Discharge: 2020-01-13 | Disposition: A | Discharge status: Home / Self Care | Payer: Managed Care, Other (non HMO) | Attending: Emergency Medicine | Admitting: Emergency Medicine

## 2020-01-12 ENCOUNTER — Other Ambulatory Visit: Payer: Self-pay

## 2020-01-12 DIAGNOSIS — Z5321 Procedure and treatment not carried out due to patient leaving prior to being seen by health care provider: Secondary | ICD-10-CM | POA: Insufficient documentation

## 2020-01-12 DIAGNOSIS — R509 Fever, unspecified: Secondary | ICD-10-CM | POA: Insufficient documentation

## 2020-01-12 HISTORY — DX: Sickle-cell disease without crisis: D57.1

## 2020-01-12 NOTE — ED Triage Notes (Signed)
Arrived POV. Patient reports fever that started today. Patient reports Hx of sickle cell but not having pain today, but wants to get IV fluids before fever cause sickle cell crisis

## 2020-01-13 ENCOUNTER — Other Ambulatory Visit: Payer: Self-pay

## 2020-01-13 ENCOUNTER — Emergency Department (HOSPITAL_BASED_OUTPATIENT_CLINIC_OR_DEPARTMENT_OTHER)
Admission: EM | Admit: 2020-01-13 | Discharge: 2020-01-13 | Disposition: A | Discharge status: Home / Self Care | Payer: Managed Care, Other (non HMO) | Attending: Emergency Medicine | Admitting: Emergency Medicine

## 2020-01-13 ENCOUNTER — Encounter (HOSPITAL_BASED_OUTPATIENT_CLINIC_OR_DEPARTMENT_OTHER): Payer: Self-pay | Admitting: Emergency Medicine

## 2020-01-13 ENCOUNTER — Telehealth (HOSPITAL_COMMUNITY): Payer: Self-pay | Admitting: *Deleted

## 2020-01-13 ENCOUNTER — Emergency Department (HOSPITAL_BASED_OUTPATIENT_CLINIC_OR_DEPARTMENT_OTHER): Payer: Managed Care, Other (non HMO)

## 2020-01-13 DIAGNOSIS — D57219 Sickle-cell/Hb-C disease with crisis, unspecified: Secondary | ICD-10-CM | POA: Insufficient documentation

## 2020-01-13 DIAGNOSIS — R0981 Nasal congestion: Secondary | ICD-10-CM | POA: Insufficient documentation

## 2020-01-13 DIAGNOSIS — J029 Acute pharyngitis, unspecified: Secondary | ICD-10-CM

## 2020-01-13 DIAGNOSIS — R509 Fever, unspecified: Secondary | ICD-10-CM

## 2020-01-13 DIAGNOSIS — B9789 Other viral agents as the cause of diseases classified elsewhere: Secondary | ICD-10-CM | POA: Diagnosis not present

## 2020-01-13 DIAGNOSIS — Z20822 Contact with and (suspected) exposure to covid-19: Secondary | ICD-10-CM | POA: Diagnosis not present

## 2020-01-13 DIAGNOSIS — J028 Acute pharyngitis due to other specified organisms: Secondary | ICD-10-CM | POA: Insufficient documentation

## 2020-01-13 LAB — SARS CORONAVIRUS 2 BY RT PCR (HOSPITAL ORDER, PERFORMED IN ~~LOC~~ HOSPITAL LAB): SARS Coronavirus 2: NEGATIVE

## 2020-01-13 LAB — CBC WITH DIFFERENTIAL/PLATELET
Abs Immature Granulocytes: 0 10*3/uL (ref 0.00–0.07)
Basophils Absolute: 0.3 10*3/uL — ABNORMAL HIGH (ref 0.0–0.1)
Basophils Relative: 1 %
Eosinophils Absolute: 0 10*3/uL (ref 0.0–0.5)
Eosinophils Relative: 0 %
HCT: 35.9 % — ABNORMAL LOW (ref 39.0–52.0)
Hemoglobin: 12.6 g/dL — ABNORMAL LOW (ref 13.0–17.0)
Lymphocytes Relative: 5 %
Lymphs Abs: 1.4 10*3/uL (ref 0.7–4.0)
MCH: 25.9 pg — ABNORMAL LOW (ref 26.0–34.0)
MCHC: 35.1 g/dL (ref 30.0–36.0)
MCV: 73.7 fL — ABNORMAL LOW (ref 80.0–100.0)
Monocytes Absolute: 3.6 10*3/uL — ABNORMAL HIGH (ref 0.1–1.0)
Monocytes Relative: 13 %
Neutro Abs: 22.7 10*3/uL — ABNORMAL HIGH (ref 1.7–7.7)
Neutrophils Relative %: 81 %
Platelets: 250 10*3/uL (ref 150–400)
RBC: 4.87 MIL/uL (ref 4.22–5.81)
RDW: 15.3 % (ref 11.5–15.5)
WBC: 28 10*3/uL — ABNORMAL HIGH (ref 4.0–10.5)
nRBC: 0.2 % (ref 0.0–0.2)

## 2020-01-13 LAB — COMPREHENSIVE METABOLIC PANEL
ALT: 12 U/L (ref 0–44)
AST: 18 U/L (ref 15–41)
Albumin: 4.6 g/dL (ref 3.5–5.0)
Alkaline Phosphatase: 55 U/L (ref 38–126)
Anion gap: 12 (ref 5–15)
BUN: 14 mg/dL (ref 6–20)
CO2: 22 mmol/L (ref 22–32)
Calcium: 8.8 mg/dL — ABNORMAL LOW (ref 8.9–10.3)
Chloride: 101 mmol/L (ref 98–111)
Creatinine, Ser: 1.2 mg/dL (ref 0.61–1.24)
GFR calc Af Amer: >60 mL/min (ref >60)
GFR calc non Af Amer: >60 mL/min (ref >60)
Glucose, Bld: 103 mg/dL — ABNORMAL HIGH (ref 70–99)
Potassium: 3.3 mmol/L — ABNORMAL LOW (ref 3.5–5.1)
Sodium: 135 mmol/L (ref 135–145)
Total Bilirubin: 3.9 mg/dL — ABNORMAL HIGH (ref 0.3–1.2)
Total Protein: 7.7 g/dL (ref 6.5–8.1)

## 2020-01-13 LAB — URINALYSIS, ROUTINE W REFLEX MICROSCOPIC
Bilirubin Urine: NEGATIVE
Glucose, UA: NEGATIVE mg/dL
Hgb urine dipstick: NEGATIVE
Ketones, ur: 15 mg/dL — AB
Leukocytes,Ua: NEGATIVE
Nitrite: NEGATIVE
Protein, ur: NEGATIVE mg/dL
Specific Gravity, Urine: 1.015 (ref 1.005–1.030)
pH: 6 (ref 5.0–8.0)

## 2020-01-13 LAB — LACTIC ACID, PLASMA: Lactic Acid, Venous: 0.7 mmol/L (ref 0.5–1.9)

## 2020-01-13 LAB — RETICULOCYTES
Immature Retic Fract: 17.3 % — ABNORMAL HIGH (ref 2.3–15.9)
RBC.: 4.76 MIL/uL (ref 4.22–5.81)
Retic Count, Absolute: 132.3 10*3/uL (ref 19.0–186.0)
Retic Ct Pct: 2.8 % (ref 0.4–3.1)

## 2020-01-13 LAB — GROUP A STREP BY PCR: Group A Strep by PCR: NOT DETECTED

## 2020-01-13 MED ORDER — DIPHENHYDRAMINE HCL 25 MG PO CAPS
25.0000 mg | ORAL_CAPSULE | Freq: Once | ORAL | Status: AC
  Administered 2020-01-13: 25 mg via ORAL
  Filled 2020-01-13: qty 1

## 2020-01-13 MED ORDER — SODIUM CHLORIDE 0.9 % IV SOLN
2.0000 g | Freq: Once | INTRAVENOUS | Status: AC
  Administered 2020-01-13: 2 g via INTRAVENOUS
  Filled 2020-01-13: qty 20

## 2020-01-13 MED ORDER — KETOROLAC TROMETHAMINE 15 MG/ML IJ SOLN
15.0000 mg | INTRAMUSCULAR | Status: AC
  Administered 2020-01-13: 15 mg via INTRAVENOUS
  Filled 2020-01-13: qty 1

## 2020-01-13 MED ORDER — DEXTROSE-NACL 5-0.45 % IV SOLN: INTRAVENOUS | Status: DC

## 2020-01-13 MED ORDER — ACETAMINOPHEN 325 MG PO TABS
650.0000 mg | ORAL_TABLET | Freq: Once | ORAL | Status: AC | PRN
  Administered 2020-01-13: 650 mg via ORAL
  Filled 2020-01-13: qty 2

## 2020-01-13 NOTE — Progress Notes (Cosign Needed)
Charles Mcgee is a 19 year old male with a history of sickle cell disease. Patient presented with fever of unknown origin. He is asplenic. Patient's chest xray showed no acute cardiopulmonary. He is negative for COVID 19 infection. Patient was treated prophylactically with Rocephin 2 g, Tylenol, and IV fluids.  Agree with ER provider that patient is appropriate to be discharged home.  Patient will follow-up at [facility] health patient care center and sickle cell day infusion center on 01/14/2020 with this provider to repeat labs.   Charles Nations  APRN, MSN, FNP-C Patient Care [facility] [address] [place], [place] [address] [phone]

## 2020-01-13 NOTE — Discharge Instructions (Addendum)
Your strep test was negative today. Your chest x-ray was unremarkable.  The treatment for viral syndrome is Tylenol, ibuprofen and rest and plenty of fluids.  As we discussed, please avoid as much as you can contacting other people and wash your hands frequently.  Please use Tylenol or ibuprofen for pain.  You may use 600 mg ibuprofen every 6 hours or 1000 mg of Tylenol every 6 hours.  You may choose to alternate between the 2.  This would be most effective.  Not to exceed 4 g of Tylenol within 24 hours.  Not to exceed 3200 mg ibuprofen 24 hours.  Please follow-up tomorrow morning with Charles Mcgee and the sickle cell clinic.  I provided you with the address and phone number.  Please be there at 8 AM.  Viral Illness TREATMENT  Treatment is directed at relieving symptoms. There is no cure. Antibiotics are not effective, because the infection is caused by a virus, not by bacteria. Treatment may include:  Increased fluid intake. Sports drinks offer valuable electrolytes, sugars, and fluids.  Breathing heated mist or steam (vaporizer or shower).  Eating chicken soup or other clear broths, and maintaining good nutrition.  Getting plenty of rest.  Using gargles or lozenges for comfort.  Increasing usage of your inhaler if you have asthma.  Return to work when your temperature has returned to normal.  Gargle warm salt water and spit it out for sore throat. Take benadryl to decrease sinus secretions. Continue to alternate between Tylenol and ibuprofen for pain and fever control.  Follow Up: Follow up with your primary care doctor in 5-7 days for recheck of ongoing symptoms.  Return to emergency department for emergent changing or worsening of symptoms.

## 2020-01-13 NOTE — Telephone Encounter (Signed)
Patient called requesting to come to the day hospital for fever. Patient reports that he is not having a sickle cell pain crisis but he has been having a persistent fever with sore throat and sinus congestion. Patient reports that he had a COVID quick test which was negative. Reports that he went to the ED yesterday but left due to long waiting time. This RN explained to the patient that the day hospital is for acute pain crisis and that we do not do workup for fever. 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 Patient advised and expresses an understanding.

## 2020-01-13 NOTE — ED Triage Notes (Signed)
Fever and sore throat since yesterday, concerned because he has a hx of sickle cell.

## 2020-01-13 NOTE — ED Provider Notes (Addendum)
[facility] EMERGENCY DEPARTMENT Provider Note   CSN: [id number] Arrival date & time: 01/13/20  1018     History Chief Complaint  Patient presents with  . Fever  . Sore Throat    Charles Mcgee is a 19 y.o. male.  HPI Patient is a 19 year old male with a history of sickle cell presenting today with sore throat since yesterday.  He states that he has had mild fever and some chills, no nausea, vomiting or diarrhea.  He states he has had no sickle cell pain crisis symptoms.  Denies any cough, hemoptysis, lightheadedness or dizziness.  He states he has had some very mild congestion.  He states he has had a history of mono as a child denies any sick contacts with negative mono recently.  He is a significant other who he states is asymptomatic.  He states he recently traveled to [place] with some friends and share drinks may have been something from them.  He states that he took Tylenol 2 tablets yesterday evening but is taken no other medications since.    Past Medical History:  Diagnosis Date  . Sickle cell anemia [facility])     Patient Active Problem List   Diagnosis Date Noted  . Sickle cell crisis (HCC) 04/22/2019    History reviewed. No pertinent surgical history.     No family history on file.  Social History   Tobacco Use  . Smoking status: Never Smoker  . Smokeless tobacco: Never Used  Substance Use Topics  . Alcohol use: Yes  . Drug use: Never    Home Medications Prior to Admission medications   Medication Sig Start Date End Date Taking? Authorizing Provider  folic acid (FOLVITE) 1 MG tablet Take 1 tablet (1 mg total) by mouth daily. 04/22/19   Massie Maroon, FNP  ibuprofen (ADVIL) 800 MG tablet Take 1 tablet (800 mg total) by mouth every 8 (eight) hours as needed. 04/22/19   Massie Maroon, FNP    Allergies    Amoxicillin  Review of Systems   Review of Systems  Constitutional: Negative for chills and fever.  HENT: Positive for sore  throat. Negative for congestion.   Respiratory: Negative for cough and shortness of breath.   Cardiovascular: Negative for chest pain.  Gastrointestinal: Negative for abdominal pain.  Musculoskeletal: Negative for neck pain.    Physical Exam Updated Vital Signs BP 116/74 Comment: rm air  Pulse 72 Comment: rm air  Temp 100.3 F (37.9 C) (Oral)   Resp 20 Comment: rm air  Ht 6' (1.829 m)   Wt 79.4 kg   SpO2 98% Comment: rm air  BMI 23.73 kg/m   Physical Exam Vitals and nursing note reviewed.  Constitutional:      General: He is not in acute distress. HENT:     Head: Normocephalic and atraumatic.     Nose: Nose normal.     Mouth/Throat:     Mouth: Mucous membranes are moist.     Tonsils: Tonsillar exudate present. 1+ on the right. 1+ on the left.  Eyes:     General: No scleral icterus. Neck:     Comments: No neck stiffness, neck with full range of motion. Scant tonsillar exudate bilaterally 1+ bilateral tonsils.  Mild posterior pharynx erythema.  Normal phonation, normal swallowing, mild bilateral cervical lymphadenopathy. Cardiovascular:     Rate and Rhythm: Normal rate and regular rhythm.     Pulses: Normal pulses.     Heart sounds: Normal heart  sounds.  Pulmonary:     Effort: Pulmonary effort is normal. No respiratory distress.     Breath sounds: No wheezing.  Abdominal:     Palpations: Abdomen is soft.     Tenderness: There is no abdominal tenderness. There is no guarding or rebound.     Comments: No abdominal tenderness.  Musculoskeletal:     Cervical back: Normal range of motion.     Right lower leg: No edema.     Left lower leg: No edema.  Skin:    General: Skin is warm and dry.     Capillary Refill: Capillary refill takes less than 2 seconds.  Neurological:     Mental Status: He is alert. Mental status is at baseline.  Psychiatric:        Mood and Affect: Mood normal.        Behavior: Behavior normal.     ED Results / Procedures / Treatments    Labs (all labs ordered are listed, but only abnormal results are displayed) Labs Reviewed  CBC WITH DIFFERENTIAL/PLATELET - Abnormal; Notable for the following components:      Result Value   WBC 28.0 (*)    Hemoglobin 12.6 (*)    HCT 35.9 (*)    MCV 73.7 (*)    MCH 25.9 (*)    Neutro Abs 22.7 (*)    Monocytes Absolute 3.6 (*)    Basophils Absolute 0.3 (*)    All other components within normal limits  COMPREHENSIVE METABOLIC PANEL - Abnormal; Notable for the following components:   Potassium 3.3 (*)    Glucose, Bld 103 (*)    Calcium 8.8 (*)    Total Bilirubin 3.9 (*)    All other components within normal limits  RETICULOCYTES - Abnormal; Notable for the following components:   Immature Retic Fract 17.3 (*)    All other components within normal limits  URINALYSIS, ROUTINE W REFLEX MICROSCOPIC - Abnormal; Notable for the following components:   Color, Urine AMBER (*)    Ketones, ur 15 (*)    All other components within normal limits  GROUP A STREP BY PCR  SARS CORONAVIRUS 2 BY RT PCR (HOSPITAL ORDER, PERFORMED IN [facility])  CULTURE, BLOOD (ROUTINE X 2)  CULTURE, BLOOD (ROUTINE X 2)  URINE CULTURE  LACTIC ACID, PLASMA  PATHOLOGIST SMEAR REVIEW    EKG None  Radiology DG Chest Portable 1 View  Result Date: 01/13/2020 CLINICAL DATA:  Cough and fever EXAM: PORTABLE CHEST 1 VIEW COMPARISON:  None. FINDINGS: Lungs are clear. Heart size and pulmonary vascularity are normal. No adenopathy. No bone lesions. IMPRESSION: No abnormality noted. Electronically Signed   By: Bretta Bang III M.D.   On: 01/13/2020 12:19    Procedures .Critical Care Performed by: Gailen Shelter, PA Authorized by: Gailen Shelter, PA   Critical care provider statement:    Critical care time (minutes):  35   Critical care time was exclusive of:  Separately billable procedures and treating other patients and teaching time   Critical care was necessary to treat or prevent  imminent or life-threatening deterioration of the following conditions: fever in sickle cell patient.   Critical care was time spent personally by me on the following activities:  Discussions with consultants, evaluation of patient's response to treatment, examination of patient, review of old charts, re-evaluation of patient's condition, pulse oximetry, ordering and review of radiographic studies, ordering and review of laboratory studies and ordering and performing treatments and  interventions   I assumed direction of critical care for this patient from another provider in my specialty: no     (including critical care time)  Medications Ordered in ED Medications  dextrose 5 %-0.45 % sodium chloride infusion ( Intravenous New Bag/Given 01/13/20 1315)  acetaminophen (TYLENOL) tablet 650 mg (650 mg Oral Given 01/13/20 1042)  cefTRIAXone (ROCEPHIN) 2 g in sodium chloride 0.9 % 100 mL IVPB (0 g Intravenous Stopped 01/13/20 1419)  diphenhydrAMINE (BENADRYL) capsule 25 mg (25 mg Oral Given 01/13/20 1315)  ketorolac (TORADOL) 15 MG/ML injection 15 mg (15 mg Intravenous Given 01/13/20 1315)    ED Course  I have reviewed the triage vital signs and the nursing notes.  Pertinent labs & imaging results that were available during my care of the patient were reviewed by me and considered in my medical decision making (see chart for details).  Pt with SSD is in today with 1 day of sore throat.  Is well-appearing in general.  Vital signs mildly deviated -- likely his fever given that his tachycardia and hypertension corrected after antipyretics.  Some suspicion for strep versus mono.  Most likely is a viral illness. I discussed this case with my attending physician Dr. Gareth Morgan.  Will provide patient with 2 g of Rocephin as prophylactic antibiotics, obtain labs, reticulocytes, Covid, mono, rapid strep, blood cultures and provide patient with some IV fluids.   Physical exam is notable for tonsillar  exudates bilaterally, tonsillar hypertrophy and cervical lymphadenopathy.   Clinical Course as of Jan 13 1515  Thu Jan 13, 2020  1227 Chest x-ray is without any infiltrate or acute abnormality.   [WF]  1227 Group A strep negative.   [WF]  1227 Patient is tolerating p.o.  Well-appearing.  Vital signs within normal limits at this time.  Mild temperature elevation still, will recommend regular Tylenol ibuprofen use.   [WF]    Clinical Course User Index [WF] Tedd Sias, [place]   Patient CMP is relatively unremarkable.  Bilirubin is mildly elevated is consistent with his normal bilirubin.  Potassium is mildly low at 3.3 I gave dietary recommendations the patient.  Lactic acid within normal limits, group A strep and mono and Covid negative.  Urinalysis for nitrites, leukocytes, hemoglobin.  There are some ketones present likely secondary to very little p.o. intake today.  No evidence of infection.  Blood culture and urine cultures pending.  CBC with significant leukocytosis of 28 with no significant anemia.  Discussed case with [facility] S[facility]le sickle cell clinic.  She will see patient tomorrow morning in clinic and redraw labs.  Patient is well-appearing on reevaluation.  Understands results of all of his tests.  Was given strict return precautions.  He is tolerating p.o. and agreeable to discharge this time he agrees to follow-up at 8 AM tomorrow at the sickle cell clinic.  MDM Rules/Calculators/A&P                           Charles Mcgee was evaluated in Emergency Department on 01/13/2020 for the symptoms described in the history of present illness. He was evaluated in the context of the global COVID-19 pandemic, which necessitated consideration that the patient might be at risk for infection with the SARS-CoV-2 virus that causes COVID-19. Institutional protocols and algorithms that pertain to the evaluation of patients at risk for COVID-19 are in a state of rapid change based on information  released by regulatory bodies including the CDC  and federal and state organizations. These policies and algorithms were followed during the patient's care in the ED.  I discussed this case with my attending physician who cosigned this note including patient's presenting symptoms, physical exam, and planned diagnostics and interventions. Attending physician stated agreement with plan or made changes to plan which were implemented.   Attending physician assessed patient at bedside.  Final Clinical Impression(s) / ED Diagnoses Final diagnoses:  Viral pharyngitis  Fever, unspecified fever cause    Rx / DC Orders ED Discharge Orders    None       Gailen Shelter, [place] 01/13/20 1516    Gailen Shelter, [place] 01/13/20 1516    Alvira Monday, MD 01/14/20 2159

## 2020-01-14 ENCOUNTER — Telehealth: Payer: Self-pay

## 2020-01-14 ENCOUNTER — Ambulatory Visit (HOSPITAL_COMMUNITY)
Admission: RE | Admit: 2020-01-14 | Discharge: 2020-01-14 | Disposition: A | Discharge status: Home / Self Care | Payer: Managed Care, Other (non HMO) | Source: Ambulatory Visit | Attending: Internal Medicine | Admitting: Internal Medicine

## 2020-01-14 DIAGNOSIS — D571 Sickle-cell disease without crisis: Secondary | ICD-10-CM | POA: Diagnosis not present

## 2020-01-14 DIAGNOSIS — D72829 Elevated white blood cell count, unspecified: Secondary | ICD-10-CM | POA: Diagnosis present

## 2020-01-14 LAB — URINE CULTURE: Culture: NO GROWTH

## 2020-01-14 LAB — CBC
HCT: 37.8 % — ABNORMAL LOW (ref 39.0–52.0)
Hemoglobin: 13.4 g/dL (ref 13.0–17.0)
MCH: 26.5 pg (ref 26.0–34.0)
MCHC: 35.4 g/dL (ref 30.0–36.0)
MCV: 74.7 fL — ABNORMAL LOW (ref 80.0–100.0)
Platelets: 206 10*3/uL (ref 150–400)
RBC: 5.06 MIL/uL (ref 4.22–5.81)
RDW: 15.1 % (ref 11.5–15.5)
WBC: 19.5 10*3/uL — ABNORMAL HIGH (ref 4.0–10.5)
nRBC: 0.1 % (ref 0.0–0.2)

## 2020-01-14 LAB — PATHOLOGIST SMEAR REVIEW

## 2020-01-14 NOTE — Progress Notes (Signed)
[facility] came [facility] the day [facility] for follow up. Patient seen at [facility] yesterday for fever. Per patient, he was given fluids and advised to take Tylenol q6 hours for fever. Patient reports feeling better today and denies having fever. Also denies pain. [place], FNP placed order to obtain CBC due to previous elevated WBC count. CBC drawn and patient tolerated well. Provider will follow up with patient concerning lab results. Patient alert, oriented and ambulatory at discharge.

## 2020-01-14 NOTE — Telephone Encounter (Signed)
-----   Message from Massie Maroon, [place] sent at 01/14/2020 11:36 AM EDT ----- Regarding: lab results Please inform patient that his WBC count has improved to 19.5. Continue with Tylenol 650 mg every 6 hours as needed for fever. Will follow up in office at scheduled appointment.    Nolon Nations  APRN, MSN, FNP-C Patient Care [facility] [address] [place], [place] [address] [phone]

## 2020-01-14 NOTE — Telephone Encounter (Signed)
Called and spoke with patient about his lab results, informed him that wbc is stable at , keeping Tylenol 650mg  for fever. Follow up in 1 month .

## 2020-01-18 LAB — CULTURE, BLOOD (ROUTINE X 2)
Culture: NO GROWTH
Culture: NO GROWTH
Special Requests: ADEQUATE

## 2020-02-22 ENCOUNTER — Ambulatory Visit (INDEPENDENT_AMBULATORY_CARE_PROVIDER_SITE_OTHER): Payer: Managed Care, Other (non HMO) | Admitting: Family Medicine

## 2020-02-22 ENCOUNTER — Encounter: Payer: Self-pay | Admitting: Family Medicine

## 2020-02-22 ENCOUNTER — Other Ambulatory Visit: Payer: Self-pay

## 2020-02-22 VITALS — BP 107/74 | HR 67 | Temp 98.6°F | Resp 16 | Ht 72.0 in | Wt 174.0 lb

## 2020-02-22 DIAGNOSIS — D5720 Sickle-cell/Hb-C disease without crisis: Secondary | ICD-10-CM

## 2020-02-22 DIAGNOSIS — D571 Sickle-cell disease without crisis: Secondary | ICD-10-CM | POA: Diagnosis not present

## 2020-02-22 LAB — POCT URINALYSIS DIPSTICK
Bilirubin, UA: NEGATIVE
Blood, UA: NEGATIVE
Glucose, UA: NEGATIVE
Ketones, UA: NEGATIVE
Leukocytes, UA: NEGATIVE
Nitrite, UA: NEGATIVE
Protein, UA: NEGATIVE
Spec Grav, UA: 1.02 (ref 1.010–1.025)
Urobilinogen, UA: 1 E.U./dL
pH, UA: 6.5 (ref 5.0–8.0)

## 2020-02-22 MED ORDER — FOLIC ACID 1 MG PO TABS: 1.0000 mg | ORAL_TABLET | Freq: Every day | ORAL | 11 refills | Status: AC

## 2020-02-22 MED ORDER — IBUPROFEN 800 MG PO TABS: 800.0000 mg | ORAL_TABLET | Freq: Three times a day (TID) | ORAL | 1 refills | Status: DC | PRN

## 2020-02-22 NOTE — Progress Notes (Signed)
[facility]   New Patient Office Visit  Subjective:  Patient ID: Charles Mcgee, male    DOB: October 18, 2000  Age: 19 y.o. MRN: [id number]  CC:  Chief Complaint  Patient presents with  . Sickle Cell Anemia    HPI Charles Mcgee is a very pleasant 19 year old male with a medical history significant for sickle cell disease type [place] presents to establish care.  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  Patient moved to this area 3 years ago from [place].  He has fairly well-controlled sickle cell.  He states that he has not been hospitalized with a sickle cell crisis since high school.  He has been to the emergency room several times, but has not required any further care.  He states that he has mostly been getting primary care through the student health center. Patient has not had a PCP since pediatrics.  He is up-to-date with all immunizations.  Patient has not required immunizations for college recently.  Also, he has been fully vaccinated against COVID-19.  He is a non-smoker.  He is currently not sexually active.  He does not perform monthly testicular exams. Patient is not having any pain on today.  He generally takes ibuprofen or Tylenol for pain control.  He says that he has taken Percocet in the past for pain management.  He is mostly opiate nave.  He denies any headache, chest pain, shortness of breath, urinary symptoms, nausea, vomiting, or diarrhea.  Patient has not had an eye exam in greater than 2 years.  Past Medical History:  Diagnosis Date  . Sickle cell anemia (HCC)     History reviewed. No pertinent surgical history.  History reviewed. No pertinent family history.  Social History   Socioeconomic History  . Marital status: Single    Spouse name: Not on file  . Number of children: Not on file  . Years of education: Not on file  . Highest education  level: Not on file  Occupational History  . Not on file  Tobacco Use  . Smoking status: Never Smoker  . Smokeless tobacco: Never Used  Vaping Use  . Vaping Use: Never used  Substance and Sexual Activity  . Alcohol use: Yes  . Drug use: Yes    Types: Marijuana  . Sexual activity: Not on file  Other Topics Concern  . Not on file  Social History Narrative  . Not on file   Social Determinants of Health   Financial Resource Strain:   . Difficulty of Paying Living Expenses:   Food Insecurity:   . Worried About Programme researcher, broadcasting/film/video in the Last Year:   . Barista in the Last Year:   Transportation Needs:   . Freight forwarder (Medical):   Marland Kitchen Lack of Transportation (Non-Medical):   Physical Activity:   . Days of Exercise per Week:   . Minutes of Exercise per Session:   Stress:   . Feeling of Stress :   Social Connections:   . Frequency of Communication with Friends and Family:   . Frequency of Social Gatherings with Friends and Family:   . Attends Religious Services:   . Active Member of Clubs or Organizations:   . Attends Banker Meetings:   Marland Kitchen Marital Status:   Intimate Partner Violence:   . Fear of Current or Ex-Partner:   .  Emotionally Abused:   Marland Kitchen Physically Abused:   . Sexually Abused:     ROS Review of Systems  Constitutional: Negative for activity change and appetite change.  HENT: Negative.   Eyes: Negative.   Respiratory: Negative.   Cardiovascular: Negative.   Gastrointestinal: Negative.   Endocrine: Negative for polydipsia, polyphagia and polyuria.  Genitourinary: Negative.   Musculoskeletal: Negative.   Skin: Negative.   Neurological: Negative.   Hematological: Negative.   Psychiatric/Behavioral: Negative.     Objective:   Today's Vitals: BP 107/74 (BP Location: Left Arm, Patient Position: Sitting, Cuff Size: Normal)   Pulse 67   Temp 98.6 F (37 C) (Oral)   Resp 16   Ht 6' (1.829 m)   Wt 174 lb (78.9 kg)   SpO2 100%    BMI 23.60 kg/m   Physical Exam Constitutional:      Appearance: Normal appearance.  HENT:     Mouth/Throat:     Mouth: Mucous membranes are moist.  Eyes:     Pupils: Pupils are equal, round, and reactive to light.  Cardiovascular:     Rate and Rhythm: Normal rate and regular rhythm.     Pulses: Normal pulses.  Pulmonary:     Effort: Pulmonary effort is normal.  Abdominal:     General: Abdomen is flat. Bowel sounds are normal.  Musculoskeletal:        General: Normal range of motion.  Neurological:     General: No focal deficit present.     Mental Status: He is alert. Mental status is at baseline.  Psychiatric:        Mood and Affect: Mood normal.        Behavior: Behavior normal.        Thought Content: Thought content normal.        Judgment: Judgment normal.     Assessment & Plan:   Problem List Items Addressed This Visit    None    Visit Diagnoses    Hb-SS disease without crisis (HCC)    -  Primary   Relevant Orders   Sickle Cell Panel   Urinalysis Dipstick (Completed)   [id number] 11+Oxyco+Alc+Crt-Bund      Outpatient Encounter Medications as of 02/22/2020  Medication Sig  . ibuprofen (ADVIL) 800 MG tablet Take 1 tablet (800 mg total) by mouth every 8 (eight) hours as needed.  . folic acid (FOLVITE) 1 MG tablet Take 1 tablet (1 mg total) by mouth daily. (Patient not taking: Reported on 02/22/2020)   No facility-administered encounter medications on file as of 02/22/2020.  1. Sickle cell-hemoglobin C disease without crisis (HCC) Sickle cell disease type [place]: Patient has very well controlled sickle cell disease and does not warrant any disease modifying agents. Continue folic acid 1 mg daily to prevent aplastic bone marrow crises.   Pulmonary evaluation - Patient denies severe recurrent wheezes, shortness of breath with exercise, or persistent cough. If these symptoms develop, pulmonary function tests with spirometry will be ordered, and if abnormal, plan on referral  to Pulmonology for further evaluation.   Cardiac - Routine screening for pulmonary hypertension is not recommended.  Eye - High risk of proliferative retinopathy. Annual eye exam with retinal exam recommended to patient.  Immunization status - Patient is up to date   Acute pain episodes- Patient does not warrant opiate medications at this time. Recommend continuing to manage with Ibuprofen and Tylenol as directed.    The above recommendations are taken from the NIH Evidence-Based Management of  Sickle Cell Disease: Expert Panel Report, 53748.   - Sickle Cell Panel - Urinalysis Dipstick - [id number] 11+Oxyco+Alc+Crt-Bund - folic acid (FOLVITE) 1 MG tablet; Take 1 tablet (1 mg total) by mouth daily.  Dispense: 30 tablet; Refill: 11 - ibuprofen (ADVIL) 800 MG tablet; Take 1 tablet (800 mg total) by mouth every 8 (eight) hours as needed.  Dispense: 30 tablet; Refill: 1   Follow-up: Return in about 6 months (around 08/24/2020).   Nolon Nations  APRN, MSN, FNP-C Patient Care [facility] [address] [place], [place] [address] [phone]

## 2020-02-22 NOTE — Patient Instructions (Signed)
Sickle Cell Anemia, Adult  Sickle cell anemia is a condition where your red blood cells are shaped like sickles. Red blood cells carry oxygen through the body. Sickle-shaped cells do not live as long as normal red blood cells. They also clump together and block blood from flowing through the blood vessels. This prevents the body from getting enough oxygen. Sickle cell anemia causes organ damage and pain. It also increases the risk of infection. Follow these instructions at home: Medicines  Take over-the-counter and prescription medicines only as told by your doctor.  If you were prescribed an antibiotic medicine, take it as told by your doctor. Do not stop taking the antibiotic even if you start to feel better.  If you develop a fever, do not take medicines to lower the fever right away. Tell your doctor about the fever. Managing pain, stiffness, and swelling  Try these methods to help with pain: ? Use a heating pad. ? Take a warm bath. ? Distract yourself, such as by watching TV. Eating and drinking  Drink enough fluid to keep your pee (urine) clear or pale yellow. Drink more in hot weather and during exercise.  Limit or avoid alcohol.  Eat a healthy diet. Eat plenty of fruits, vegetables, whole grains, and lean protein.  Take vitamins and supplements as told by your doctor. Traveling  When traveling, keep these with you: ? Your medical information. ? The names of your doctors. ? Your medicines.  If you need to take an airplane, talk to your doctor first. Activity  Rest often.  Avoid exercises that make your heart beat much faster, such as jogging. General instructions  Do not use products that have nicotine or tobacco, such as cigarettes and e-cigarettes. If you need help quitting, ask your doctor.  Consider wearing a medical alert bracelet.  Avoid being in high places (high altitudes), such as mountains.  Avoid very hot or cold temperatures.  Avoid places where the  temperature changes a lot.  Keep all follow-up visits as told by your doctor. This is important. Contact a doctor if:  A joint hurts.  Your feet or hands hurt or swell.  You feel tired (fatigued). Get help right away if:  You have symptoms of infection. These include: ? Fever. ? Chills. ? Being very tired. ? Irritability. ? Poor eating. ? Throwing up (vomiting).  You feel dizzy or faint.  You have new stomach pain, especially on the left side.  You have a an erection (priapism) that lasts more than 4 hours.  You have numbness in your arms or legs.  You have a hard time moving your arms or legs.  You have trouble talking.  You have pain that does not go away when you take medicine.  You are short of breath.  You are breathing fast.  You have a long-term cough.  You have pain in your chest.  You have a bad headache.  You have a stiff neck.  Your stomach looks bloated even though you did not eat much.  Your skin is pale.  You suddenly cannot see well. Summary  Sickle cell anemia is a condition where your red blood cells are shaped like sickles.  Follow your doctor's advice on ways to manage pain, food to eat, activities to do, and steps to take for safe travel.  Get medical help right away if you have any signs of infection, such as a fever. This information is not intended to replace advice given to you by   your health care provider. Make sure you discuss any questions you have with your health care provider. Document Revised: 11/06/2018 Document Reviewed: 08/20/2016 Elsevier Patient Education  2020 Elsevier Inc.  

## 2020-02-23 LAB — CMP14+CBC/D/PLT+FER+RETIC+V...
ALT: 9 IU/L (ref 0–44)
AST: 20 IU/L (ref 0–40)
Albumin/Globulin Ratio: 2 (ref 1.2–2.2)
Albumin: 4.8 g/dL (ref 4.1–5.2)
Alkaline Phosphatase: 68 IU/L (ref 48–121)
BUN/Creatinine Ratio: 10 (ref 9–20)
BUN: 11 mg/dL (ref 6–20)
Basophils Absolute: 0.1 10*3/uL (ref 0.0–0.2)
Basos: 1 %
Bilirubin Total: 2.4 mg/dL — ABNORMAL HIGH (ref 0.0–1.2)
CO2: 22 mmol/L (ref 20–29)
Calcium: 9.7 mg/dL (ref 8.7–10.2)
Chloride: 102 mmol/L (ref 96–106)
Creatinine, Ser: 1.15 mg/dL (ref 0.76–1.27)
EOS (ABSOLUTE): 0.4 10*3/uL (ref 0.0–0.4)
Eos: 4 %
Ferritin: 215 ng/mL (ref 30–400)
GFR calc Af Amer: 105 mL/min/{1.73_m2} (ref >59)
GFR calc non Af Amer: 90 mL/min/{1.73_m2} (ref >59)
Globulin, Total: 2.4 g/dL (ref 1.5–4.5)
Glucose: 86 mg/dL (ref 65–99)
Hematocrit: 42.3 % (ref 37.5–51.0)
Hemoglobin: 13.6 g/dL (ref 13.0–17.7)
Immature Grans (Abs): 0 10*3/uL (ref 0.0–0.1)
Immature Granulocytes: 0 %
Lymphocytes Absolute: 2.4 10*3/uL (ref 0.7–3.1)
Lymphs: 28 %
MCH: 26 pg — ABNORMAL LOW (ref 26.6–33.0)
MCHC: 32.2 g/dL (ref 31.5–35.7)
MCV: 81 fL (ref 79–97)
Monocytes Absolute: 0.8 10*3/uL (ref 0.1–0.9)
Monocytes: 9 %
NRBC: 1 % — ABNORMAL HIGH (ref 0–0)
Neutrophils Absolute: 5 10*3/uL (ref 1.4–7.0)
Neutrophils: 58 %
Platelets: 326 10*3/uL (ref 150–450)
Potassium: 4.6 mmol/L (ref 3.5–5.2)
RBC: 5.23 x10E6/uL (ref 4.14–5.80)
RDW: 17 % — ABNORMAL HIGH (ref 11.6–15.4)
Retic Ct Pct: 3 % — ABNORMAL HIGH (ref 0.6–2.6)
Sodium: 138 mmol/L (ref 134–144)
Total Protein: 7.2 g/dL (ref 6.0–8.5)
Vit D, 25-Hydroxy: 20.2 ng/mL — ABNORMAL LOW (ref 30.0–100.0)
WBC: 8.7 10*3/uL (ref 3.4–10.8)

## 2020-03-02 LAB — DRUG SCREEN 764883 11+OXYCO+ALC+CRT-BUND
Amphetamines, Urine: NEGATIVE ng/mL
BENZODIAZ UR QL: NEGATIVE ng/mL
Barbiturate: NEGATIVE ng/mL
Cocaine (Metabolite): NEGATIVE ng/mL
Creatinine: 123.4 mg/dL (ref 20.0–300.0)
Ethanol: NEGATIVE %
Meperidine: NEGATIVE ng/mL
Methadone Screen, Urine: NEGATIVE ng/mL
OPIATE SCREEN URINE: NEGATIVE ng/mL
Oxycodone/Oxymorphone, Urine: NEGATIVE ng/mL
Phencyclidine: NEGATIVE ng/mL
Propoxyphene: NEGATIVE ng/mL
Tramadol: NEGATIVE ng/mL
pH, Urine: 6.3 (ref 4.5–8.9)

## 2020-03-02 LAB — CANNABINOID CONFIRMATION, UR
CANNABINOIDS: POSITIVE — AB
Carboxy THC GC/MS Conf: 146 ng/mL

## 2020-08-22 ENCOUNTER — Ambulatory Visit: Payer: Managed Care, Other (non HMO) | Admitting: Family Medicine

## 2020-09-05 ENCOUNTER — Ambulatory Visit (INDEPENDENT_AMBULATORY_CARE_PROVIDER_SITE_OTHER): Payer: Managed Care, Other (non HMO) | Admitting: Family Medicine

## 2020-09-05 ENCOUNTER — Encounter: Payer: Self-pay | Admitting: Family Medicine

## 2020-09-05 ENCOUNTER — Other Ambulatory Visit: Payer: Self-pay

## 2020-09-05 VITALS — BP 107/46 | HR 67 | Temp 98.1°F | Ht 72.0 in | Wt 175.0 lb

## 2020-09-05 DIAGNOSIS — E559 Vitamin D deficiency, unspecified: Secondary | ICD-10-CM | POA: Diagnosis not present

## 2020-09-05 DIAGNOSIS — D5720 Sickle-cell/Hb-C disease without crisis: Secondary | ICD-10-CM

## 2020-09-05 DIAGNOSIS — Z113 Encounter for screening for infections with a predominantly sexual mode of transmission: Secondary | ICD-10-CM | POA: Diagnosis not present

## 2020-09-05 DIAGNOSIS — D5700 Hb-SS disease with crisis, unspecified: Secondary | ICD-10-CM

## 2020-09-05 LAB — POCT URINALYSIS DIPSTICK
Bilirubin, UA: NEGATIVE
Blood, UA: NEGATIVE
Glucose, UA: NEGATIVE
Ketones, UA: NEGATIVE
Leukocytes, UA: NEGATIVE
Nitrite, UA: NEGATIVE
Protein, UA: NEGATIVE
Spec Grav, UA: 1.015 (ref 1.010–1.025)
Urobilinogen, UA: 0.2 E.U./dL
pH, UA: 6 (ref 5.0–8.0)

## 2020-09-06 ENCOUNTER — Other Ambulatory Visit: Payer: Self-pay | Admitting: Family Medicine

## 2020-09-06 DIAGNOSIS — E559 Vitamin D deficiency, unspecified: Secondary | ICD-10-CM

## 2020-09-06 LAB — CMP14+CBC/D/PLT+FER+RETIC+V...
ALT: 8 IU/L (ref 0–44)
AST: 20 IU/L (ref 0–40)
Albumin/Globulin Ratio: 2.3 — ABNORMAL HIGH (ref 1.2–2.2)
Albumin: 4.5 g/dL (ref 4.1–5.2)
Alkaline Phosphatase: 65 IU/L (ref 44–121)
BUN/Creatinine Ratio: 10 (ref 9–20)
BUN: 10 mg/dL (ref 6–20)
Basophils Absolute: 0.1 10*3/uL (ref 0.0–0.2)
Basos: 1 %
Bilirubin Total: 2 mg/dL — ABNORMAL HIGH (ref 0.0–1.2)
CO2: 21 mmol/L (ref 20–29)
Calcium: 9.1 mg/dL (ref 8.7–10.2)
Chloride: 103 mmol/L (ref 96–106)
Creatinine, Ser: 1.02 mg/dL (ref 0.76–1.27)
EOS (ABSOLUTE): 0.2 10*3/uL (ref 0.0–0.4)
Eos: 3 %
Ferritin: 181 ng/mL (ref 30–400)
GFR calc Af Amer: 121 mL/min/{1.73_m2} (ref >59)
GFR calc non Af Amer: 105 mL/min/{1.73_m2} (ref >59)
Globulin, Total: 2 g/dL (ref 1.5–4.5)
Glucose: 73 mg/dL (ref 65–99)
Hematocrit: 36.5 % — ABNORMAL LOW (ref 37.5–51.0)
Hemoglobin: 11.9 g/dL — ABNORMAL LOW (ref 13.0–17.7)
Immature Grans (Abs): 0 10*3/uL (ref 0.0–0.1)
Immature Granulocytes: 0 %
Lymphocytes Absolute: 2.9 10*3/uL (ref 0.7–3.1)
Lymphs: 35 %
MCH: 26.1 pg — ABNORMAL LOW (ref 26.6–33.0)
MCHC: 32.6 g/dL (ref 31.5–35.7)
MCV: 80 fL (ref 79–97)
Monocytes Absolute: 0.9 10*3/uL (ref 0.1–0.9)
Monocytes: 11 %
NRBC: 1 % — ABNORMAL HIGH (ref 0–0)
Neutrophils Absolute: 4.1 10*3/uL (ref 1.4–7.0)
Neutrophils: 50 %
Platelets: 310 10*3/uL (ref 150–450)
Potassium: 4.3 mmol/L (ref 3.5–5.2)
RBC: 4.56 x10E6/uL (ref 4.14–5.80)
RDW: 15.8 % — ABNORMAL HIGH (ref 11.6–15.4)
Retic Ct Pct: 2.4 % (ref 0.6–2.6)
Sodium: 138 mmol/L (ref 134–144)
Total Protein: 6.5 g/dL (ref 6.0–8.5)
Vit D, 25-Hydroxy: 6.3 ng/mL — ABNORMAL LOW (ref 30.0–100.0)
WBC: 8.2 10*3/uL (ref 3.4–10.8)

## 2020-09-06 LAB — GC/CHLAMYDIA PROBE AMP
Chlamydia trachomatis, NAA: NEGATIVE
Neisseria Gonorrhoeae by PCR: NEGATIVE

## 2020-09-06 LAB — RPR: RPR Ser Ql: NONREACTIVE

## 2020-09-06 LAB — HEPATITIS C ANTIBODY: Hep C Virus Ab: <0.1 s/co ratio (ref 0.0–0.9)

## 2020-09-06 LAB — HIV ANTIBODY (ROUTINE TESTING W REFLEX): HIV Screen 4th Generation wRfx: NONREACTIVE

## 2020-09-06 MED ORDER — ERGOCALCIFEROL 1.25 MG (50000 UT) PO CAPS: 50000.0000 [IU] | ORAL_CAPSULE | ORAL | 1 refills | Status: DC

## 2020-09-06 NOTE — Progress Notes (Signed)
Meds ordered this encounter  Medications  . ergocalciferol (VITAMIN D2) 1.25 MG (50000 UT) capsule    Sig: Take 1 capsule (50,000 Units total) by mouth once a week.    Dispense:  12 capsule    Refill:  1    Order Specific Question:   Supervising Provider    Answer:   JEGEDE, OLUGBEMIGA E [[id number]]   

## 2020-09-07 ENCOUNTER — Telehealth: Payer: Self-pay

## 2020-09-07 NOTE — Telephone Encounter (Signed)
-----   Message from Massie Maroon, [place] sent at 09/06/2020  4:54 PM EST ----- Regarding: lab results Please inform patient that vitamin D is 6, which is extremely decreased.  We will start weekly vitamin D supplementation with Drisdol 50,000 units once weekly.  Also, increase foods that are rich in vitamin D.  All of the laboratory values are within normal limits.  Nolon Nations  APRN, MSN, FNP-C Patient Care [facility] [address] [place], [place] [address] [phone]

## 2020-09-07 NOTE — Telephone Encounter (Signed)
Called and spoke w/ pt informed patient of his results, also to start vit d 50,000 once a week, all his other labs are with normal range.

## 2020-09-07 NOTE — Telephone Encounter (Signed)
-----   Message from Lachina M Hollis, FNP sent at 09/06/2020  4:54 PM EST ----- Regarding: lab results Please inform patient that vitamin D is 6, which is extremely decreased.  We will start weekly vitamin D supplementation with Drisdol 50,000 units once weekly.  Also, increase foods that are rich in vitamin D.  All of the laboratory values are within normal limits.  Lachina Moore Hollis  APRN, MSN, FNP-C [facility] [address]  [place], [place] [address] [phone]    

## 2020-09-07 NOTE — Telephone Encounter (Signed)
Tried to call patient his voicemail.

## 2020-09-14 NOTE — Progress Notes (Signed)
[facility]   Established Patient Office Visit  Subjective:  Patient ID: Charles Mcgee, male    DOB: 06-Oct-2000  Age: 20 y.o. MRN: [id number]  CC:  Chief Complaint  Patient presents with  . Follow-up    Follow up ,sickle cell     HPI Charles Mcgee is a 20 year old male with a medical history significant for sickle cell disease and vitamin D deficiency that presents for follow-up of chronic conditions.  Patient says that he has been doing well and is without complaint on today.  He has moderately well-controlled sickle cell disease with infrequent pain crises.  Patient is not having any pain on today.  He denies any shortness of breath, headache, chest pain, dizziness, urinary symptoms, nausea, vomiting, or diarrhea.  Patient has been hydrated consistently but has not been following a balanced diet.  He is a non-smoker. Patient is up-to-date with vaccinations.  He is sexually active and mostly uses barrier protection.  He does not perform monthly self testicular exams.  Past Medical History:  Diagnosis Date  . Sickle cell anemia (HCC)     No past surgical history on file.  No family history on file.  Social History   Socioeconomic History  . Marital status: Single    Spouse name: Not on file  . Number of children: Not on file  . Years of education: Not on file  . Highest education level: Not on file  Occupational History  . Not on file  Tobacco Use  . Smoking status: Never Smoker  . Smokeless tobacco: Never Used  Vaping Use  . Vaping Use: Never used  Substance and Sexual Activity  . Alcohol use: Yes  . Drug use: Yes    Types: Marijuana  . Sexual activity: Not on file  Other Topics Concern  . Not on file  Social History Narrative  . Not on file   Social Determinants of Health   Financial Resource Strain: Not on file  Food Insecurity: Not on file  Transportation Needs: Not on file  Physical Activity: Not on file   Stress: Not on file  Social Connections: Not on file  Intimate Partner Violence: Not on file    Outpatient Medications Prior to Visit  Medication Sig Dispense Refill  . folic acid (FOLVITE) 1 MG tablet Take 1 tablet (1 mg total) by mouth daily. 30 tablet 11  . ibuprofen (ADVIL) 800 MG tablet Take 1 tablet (800 mg total) by mouth every 8 (eight) hours as needed. 30 tablet 1   No facility-administered medications prior to visit.    Allergies  Allergen Reactions  . Amoxicillin Itching    ROS Review of Systems  Constitutional: Negative for activity change and appetite change.  HENT: Negative.   Eyes: Negative.   Respiratory: Negative.   Cardiovascular: Negative.  Negative for leg swelling.  Gastrointestinal: Negative.   Genitourinary: Negative.   Musculoskeletal: Negative.   Skin: Negative.   Allergic/Immunologic: Negative.   Neurological: Negative.   Psychiatric/Behavioral: Negative.       Objective:    Physical Exam Constitutional:      Appearance: Normal appearance.  HENT:     Mouth/Throat:     Mouth: Mucous membranes are moist.  Eyes:     Pupils: Pupils are equal, round, and reactive to light.  Cardiovascular:     Rate and Rhythm: Normal rate and regular rhythm.     Pulses: Normal pulses.  Pulmonary:     Effort:  Pulmonary effort is normal.     Breath sounds: Normal breath sounds.  Abdominal:     General: Bowel sounds are normal.  Neurological:     General: No focal deficit present.     Mental Status: He is alert.  Psychiatric:        Mood and Affect: Mood normal.        Behavior: Behavior normal.        Thought Content: Thought content normal.        Judgment: Judgment normal.     BP (!) 107/46 (BP Location: Left Arm, Patient Position: Sitting, Cuff Size: Large)   Pulse 67   Temp 98.1 F (36.7 C) (Temporal)   Ht 6' (1.829 m)   Wt 175 lb (79.4 kg)   SpO2 97%   BMI 23.73 kg/m  Wt Readings from Last 3 Encounters:  09/05/20 175 lb (79.4 kg)   02/22/20 174 lb (78.9 kg)  01/13/20 175 lb (79.4 kg)     Health Maintenance Due  Topic Date Due  . TETANUS/TDAP  Never done  . COVID-19 Vaccine (3 - Booster for Moderna series) 05/24/2020    There are no preventive care reminders to display for this patient.  No results found for: TSH Lab Results  Component Value Date   WBC 8.2 09/05/2020   HGB 11.9 (L) 09/05/2020   HCT 36.5 (L) 09/05/2020   MCV 80 09/05/2020   PLT 310 09/05/2020   Lab Results  Component Value Date   NA 138 09/05/2020   K 4.3 09/05/2020   CO2 21 09/05/2020   GLUCOSE 73 09/05/2020   BUN 10 09/05/2020   CREATININE 1.02 09/05/2020   BILITOT 2.0 (H) 09/05/2020   ALKPHOS 65 09/05/2020   AST 20 09/05/2020   ALT 8 09/05/2020   PROT 6.5 09/05/2020   ALBUMIN 4.5 09/05/2020   CALCIUM 9.1 09/05/2020   ANIONGAP 12 01/13/2020   No results found for: CHOL No results found for: HDL No results found for: LDLCALC No results found for: TRIG No results found for: CHOLHDL No results found for: ZRAQ7M    Assessment & Plan:   Problem List Items Addressed This Visit   None   Visit Diagnoses    Sickle cell-hemoglobin C disease without crisis (HCC)    -  Primary   Relevant Orders   POCT Urinalysis Dipstick (Completed)   Sickle Cell Panel (Completed)   Screening for STD (sexually transmitted disease)       Relevant Orders   Hepatitis C Antibody (Completed)   HIV antibody (with reflex) (Completed)   RPR (Completed)   GC/Chlamydia Probe Amp([facility]) (Completed)   Vitamin D deficiency           1. Sickle cell-hemoglobin C disease without crisis (HCC)  Continue folic acid 1 mg daily to prevent aplastic bone marrow crises.   Pulmonary evaluation - Patient denies severe recurrent wheezes, shortness of breath with exercise, or persistent cough. If these symptoms develop, pulmonary function tests with spirometry will be ordered, and if abnormal, plan on referral to Pulmonology for further evaluation.  Cardiac  - Routine screening for pulmonary hypertension is not recommended.  Eye - High risk of proliferative retinopathy. Annual eye exam with retinal exam recommended to patient. - POCT Urinalysis Dipstick - Sickle Cell Panel  2. Screening for STD (sexually transmitted disease) - Hepatitis C Antibody - HIV antibody (with reflex) - RPR - GC/Chlamydia Probe Amp([facility])  3. Vitamin D deficiency  - Sickle Cell Panel  Follow-up:  No follow-ups on file.    Nolon Nations  APRN, MSN, FNP-C Patient Care [facility] [address] [place], [place] [address] [phone]

## 2020-11-21 ENCOUNTER — Telehealth (HOSPITAL_COMMUNITY): Payer: Self-pay | Admitting: *Deleted

## 2020-11-21 ENCOUNTER — Emergency Department (HOSPITAL_COMMUNITY): Payer: Managed Care, Other (non HMO)

## 2020-11-21 ENCOUNTER — Inpatient Hospital Stay (HOSPITAL_COMMUNITY)
Admission: EM | Admit: 2020-11-21 | Discharge: 2020-11-25 | DRG: 811 | Disposition: A | Discharge status: Home / Self Care | Payer: Managed Care, Other (non HMO) | Attending: Internal Medicine | Admitting: Internal Medicine

## 2020-11-21 ENCOUNTER — Other Ambulatory Visit: Payer: Self-pay

## 2020-11-21 ENCOUNTER — Encounter (HOSPITAL_COMMUNITY): Payer: Self-pay

## 2020-11-21 DIAGNOSIS — Z20822 Contact with and (suspected) exposure to covid-19: Secondary | ICD-10-CM | POA: Diagnosis present

## 2020-11-21 DIAGNOSIS — D72829 Elevated white blood cell count, unspecified: Secondary | ICD-10-CM

## 2020-11-21 DIAGNOSIS — D5701 Hb-SS disease with acute chest syndrome: Secondary | ICD-10-CM | POA: Diagnosis present

## 2020-11-21 DIAGNOSIS — J9811 Atelectasis: Secondary | ICD-10-CM | POA: Diagnosis present

## 2020-11-21 DIAGNOSIS — J189 Pneumonia, unspecified organism: Secondary | ICD-10-CM | POA: Diagnosis present

## 2020-11-21 DIAGNOSIS — D5700 Hb-SS disease with crisis, unspecified: Secondary | ICD-10-CM | POA: Diagnosis present

## 2020-11-21 DIAGNOSIS — Z880 Allergy status to penicillin: Secondary | ICD-10-CM | POA: Diagnosis not present

## 2020-11-21 DIAGNOSIS — Z79899 Other long term (current) drug therapy: Secondary | ICD-10-CM

## 2020-11-21 DIAGNOSIS — N179 Acute kidney failure, unspecified: Secondary | ICD-10-CM | POA: Diagnosis present

## 2020-11-21 LAB — URINALYSIS, ROUTINE W REFLEX MICROSCOPIC
Bilirubin Urine: NEGATIVE
Glucose, UA: NEGATIVE mg/dL
Hgb urine dipstick: NEGATIVE
Ketones, ur: NEGATIVE mg/dL
Leukocytes,Ua: NEGATIVE
Nitrite: NEGATIVE
Protein, ur: NEGATIVE mg/dL
Specific Gravity, Urine: 1.016 (ref 1.005–1.030)
pH: 5 (ref 5.0–8.0)

## 2020-11-21 LAB — CBC WITH DIFFERENTIAL/PLATELET
Abs Immature Granulocytes: 0.76 10*3/uL — ABNORMAL HIGH (ref 0.00–0.07)
Basophils Absolute: 0.1 10*3/uL (ref 0.0–0.1)
Basophils Relative: 0 %
Eosinophils Absolute: 0 10*3/uL (ref 0.0–0.5)
Eosinophils Relative: 0 %
HCT: 34 % — ABNORMAL LOW (ref 39.0–52.0)
Hemoglobin: 12.1 g/dL — ABNORMAL LOW (ref 13.0–17.0)
Immature Granulocytes: 2 %
Lymphocytes Relative: 2 %
Lymphs Abs: 0.8 10*3/uL (ref 0.7–4.0)
MCH: 26.8 pg (ref 26.0–34.0)
MCHC: 35.6 g/dL (ref 30.0–36.0)
MCV: 75.2 fL — ABNORMAL LOW (ref 80.0–100.0)
Monocytes Absolute: 4.3 10*3/uL — ABNORMAL HIGH (ref 0.1–1.0)
Monocytes Relative: 10 %
Neutro Abs: 37.2 10*3/uL — ABNORMAL HIGH (ref 1.7–7.7)
Neutrophils Relative %: 86 %
Platelets: 397 10*3/uL (ref 150–400)
RBC: 4.52 MIL/uL (ref 4.22–5.81)
RDW: 14.8 % (ref 11.5–15.5)
WBC: 43.2 10*3/uL — ABNORMAL HIGH (ref 4.0–10.5)
nRBC: 0.1 % (ref 0.0–0.2)

## 2020-11-21 LAB — COMPREHENSIVE METABOLIC PANEL
ALT: 11 U/L (ref 0–44)
AST: 17 U/L (ref 15–41)
Albumin: 4.1 g/dL (ref 3.5–5.0)
Alkaline Phosphatase: 47 U/L (ref 38–126)
Anion gap: 11 (ref 5–15)
BUN: 12 mg/dL (ref 6–20)
CO2: 21 mmol/L — ABNORMAL LOW (ref 22–32)
Calcium: 9.1 mg/dL (ref 8.9–10.3)
Chloride: 104 mmol/L (ref 98–111)
Creatinine, Ser: 1.19 mg/dL (ref 0.61–1.24)
GFR, Estimated: >60 mL/min (ref >60)
Glucose, Bld: 131 mg/dL — ABNORMAL HIGH (ref 70–99)
Potassium: 3.5 mmol/L (ref 3.5–5.1)
Sodium: 136 mmol/L (ref 135–145)
Total Bilirubin: 2.8 mg/dL — ABNORMAL HIGH (ref 0.3–1.2)
Total Protein: 8.1 g/dL (ref 6.5–8.1)

## 2020-11-21 LAB — RETICULOCYTES
Immature Retic Fract: 13.4 % (ref 2.3–15.9)
RBC.: 4.39 MIL/uL (ref 4.22–5.81)
Retic Count, Absolute: 88.2 10*3/uL (ref 19.0–186.0)
Retic Ct Pct: 2 % (ref 0.4–3.1)

## 2020-11-21 LAB — RESP PANEL BY RT-PCR (FLU A&B, COVID) ARPGX2
Influenza A by PCR: NEGATIVE
Influenza B by PCR: NEGATIVE
SARS Coronavirus 2 by RT PCR: NEGATIVE

## 2020-11-21 LAB — LIPASE, BLOOD: Lipase: 23 U/L (ref 11–51)

## 2020-11-21 MED ORDER — KETOROLAC TROMETHAMINE 15 MG/ML IJ SOLN
15.0000 mg | Freq: Four times a day (QID) | INTRAMUSCULAR | Status: DC
  Administered 2020-11-21 – 2020-11-22 (×4): 15 mg via INTRAVENOUS
  Filled 2020-11-21 (×4): qty 1

## 2020-11-21 MED ORDER — ACETAMINOPHEN 500 MG PO TABS
1000.0000 mg | ORAL_TABLET | Freq: Once | ORAL | Status: AC
  Administered 2020-11-21: 1000 mg via ORAL
  Filled 2020-11-21: qty 2

## 2020-11-21 MED ORDER — SODIUM CHLORIDE 0.9 % IV SOLN
500.0000 mg | Freq: Once | INTRAVENOUS | Status: AC
  Administered 2020-11-21: 500 mg via INTRAVENOUS
  Filled 2020-11-21: qty 500

## 2020-11-21 MED ORDER — DIPHENHYDRAMINE HCL 12.5 MG/5ML PO ELIX: 12.5000 mg | ORAL_SOLUTION | Freq: Four times a day (QID) | ORAL | Status: DC | PRN

## 2020-11-21 MED ORDER — KETOROLAC TROMETHAMINE 15 MG/ML IJ SOLN
15.0000 mg | INTRAMUSCULAR | Status: AC
  Administered 2020-11-21: 15 mg via INTRAVENOUS
  Filled 2020-11-21: qty 1

## 2020-11-21 MED ORDER — SODIUM CHLORIDE 0.9 % IV SOLN
1.0000 g | Freq: Once | INTRAVENOUS | Status: AC
  Administered 2020-11-21: 1 g via INTRAVENOUS
  Filled 2020-11-21: qty 10

## 2020-11-21 MED ORDER — SODIUM CHLORIDE 0.9 % IV SOLN
1.0000 g | INTRAVENOUS | Status: DC
  Filled 2020-11-21: qty 10

## 2020-11-21 MED ORDER — ONDANSETRON HCL 4 MG/2ML IJ SOLN: 4.0000 mg | Freq: Four times a day (QID) | INTRAMUSCULAR | Status: DC | PRN

## 2020-11-21 MED ORDER — SODIUM CHLORIDE 0.9 % IV SOLN
500.0000 mg | INTRAVENOUS | Status: DC
  Administered 2020-11-22 – 2020-11-25 (×4): 500 mg via INTRAVENOUS
  Filled 2020-11-21 (×4): qty 500

## 2020-11-21 MED ORDER — POLYETHYLENE GLYCOL 3350 17 G PO PACK
17.0000 g | PACK | Freq: Every day | ORAL | Status: DC | PRN
  Administered 2020-11-22 – 2020-11-25 (×3): 17 g via ORAL
  Filled 2020-11-21 (×4): qty 1

## 2020-11-21 MED ORDER — SODIUM CHLORIDE 0.45 % IV SOLN: INTRAVENOUS | Status: AC

## 2020-11-21 MED ORDER — NALOXONE HCL 0.4 MG/ML IJ SOLN: 0.4000 mg | INTRAMUSCULAR | Status: DC | PRN

## 2020-11-21 MED ORDER — HYDROMORPHONE 1 MG/ML IV SOLN
INTRAVENOUS | Status: DC
  Administered 2020-11-21: 1.8 mg via INTRAVENOUS
  Administered 2020-11-21: 3.6 mg via INTRAVENOUS
  Administered 2020-11-21: 30 mg via INTRAVENOUS
  Administered 2020-11-22 (×2): 1.8 mg via INTRAVENOUS
  Administered 2020-11-22: 1.2 mg via INTRAVENOUS
  Administered 2020-11-22: 2.1 mg via INTRAVENOUS
  Administered 2020-11-22: 2.7 mg via INTRAVENOUS
  Administered 2020-11-22: 1.8 mg via INTRAVENOUS
  Administered 2020-11-23: 0 mg via INTRAVENOUS
  Filled 2020-11-21: qty 30

## 2020-11-21 MED ORDER — FOLIC ACID 1 MG PO TABS
1.0000 mg | ORAL_TABLET | Freq: Every day | ORAL | Status: DC
  Administered 2020-11-21 – 2020-11-25 (×5): 1 mg via ORAL
  Filled 2020-11-21 (×5): qty 1

## 2020-11-21 MED ORDER — SODIUM CHLORIDE 0.9% FLUSH: 9.0000 mL | INTRAVENOUS | Status: DC | PRN

## 2020-11-21 MED ORDER — SENNOSIDES-DOCUSATE SODIUM 8.6-50 MG PO TABS
1.0000 | ORAL_TABLET | Freq: Two times a day (BID) | ORAL | Status: DC
  Administered 2020-11-21 – 2020-11-25 (×9): 1 via ORAL
  Filled 2020-11-21 (×9): qty 1

## 2020-11-21 MED ORDER — HYDROMORPHONE HCL 1 MG/ML IJ SOLN
1.0000 mg | Freq: Once | INTRAMUSCULAR | Status: AC
  Administered 2020-11-21: 1 mg via INTRAVENOUS
  Filled 2020-11-21: qty 1

## 2020-11-21 MED ORDER — GUAIFENESIN 100 MG/5ML PO SOLN
5.0000 mL | ORAL | Status: DC | PRN
  Administered 2020-11-21 – 2020-11-24 (×9): 100 mg via ORAL
  Filled 2020-11-21 (×9): qty 10

## 2020-11-21 MED ORDER — DIPHENHYDRAMINE HCL 50 MG/ML IJ SOLN: 12.5000 mg | Freq: Four times a day (QID) | INTRAMUSCULAR | Status: DC | PRN

## 2020-11-21 MED ORDER — ACETAMINOPHEN 325 MG PO TABS
650.0000 mg | ORAL_TABLET | Freq: Four times a day (QID) | ORAL | Status: DC | PRN
  Administered 2020-11-21 – 2020-11-22 (×2): 650 mg via ORAL
  Filled 2020-11-21 (×2): qty 2

## 2020-11-21 MED ORDER — ENOXAPARIN SODIUM 40 MG/0.4ML ~~LOC~~ SOLN
40.0000 mg | SUBCUTANEOUS | Status: DC
  Administered 2020-11-21 – 2020-11-24 (×4): 40 mg via SUBCUTANEOUS
  Filled 2020-11-21 (×4): qty 0.4

## 2020-11-21 NOTE — ED Provider Notes (Signed)
[facility]-EMERGENCY DEPT Provider Note   CSN: [id number] Arrival date & time: 11/21/20  0160     History Chief Complaint  Patient presents with  . Cough  . Shortness of Breath    Charles Mcgee is a 20 y.o. male.  HPI Adult male with a history of sickle cell disease presents with 1 day of fever, chest pain, vomiting.  He notes that he does have episodes of pain, but in general his sickle cell is well controlled with as needed hydrocodone.  He has been sick for 2 days, now using Motrin, without substantial change.  Pain is focal in the right lower chest wall, little pain on the left side.  There is associated anorexia, and he vomited yesterday.  Fever was last night. No syncope, though he does feel lightheaded.  Past Medical History:  Diagnosis Date  . Sickle cell anemia [facility])     Patient Active Problem List   Diagnosis Date Noted  . Vitamin D deficiency 09/06/2020  . Sickle cell crisis (HCC) 04/22/2019    History reviewed. No pertinent surgical history.     No family history on file.  Social History   Tobacco Use  . Smoking status: Never Smoker  . Smokeless tobacco: Never Used  Vaping Use  . Vaping Use: Never used  Substance Use Topics  . Alcohol use: Yes  . Drug use: Yes    Types: Marijuana    Home Medications Prior to Admission medications   Medication Sig Start Date End Date Taking? Authorizing Provider  ergocalciferol (VITAMIN D2) 1.25 MG (50000 UT) capsule Take 1 capsule (50,000 Units total) by mouth once a week. 09/06/20   Massie Maroon, FNP  folic acid (FOLVITE) 1 MG tablet Take 1 tablet (1 mg total) by mouth daily. 02/22/20   Massie Maroon, FNP  ibuprofen (ADVIL) 800 MG tablet Take 1 tablet (800 mg total) by mouth every 8 (eight) hours as needed. 02/22/20   Massie Maroon, FNP    Allergies    Amoxicillin  Review of Systems   Review of Systems  Constitutional:       Per HPI, otherwise negative  HENT:       Per HPI,  otherwise negative  Respiratory:       Per HPI, otherwise negative  Cardiovascular:       Per HPI, otherwise negative  Gastrointestinal: Positive for nausea and vomiting.  Endocrine:       Negative aside from HPI  Genitourinary:       Neg aside from HPI   Musculoskeletal:       Per HPI, otherwise negative  Skin: Negative.   Allergic/Immunologic: Positive for immunocompromised state.  Neurological: Negative for syncope.    Physical Exam Updated Vital Signs BP 110/62 (BP Location: Left Arm)   Pulse 94   Temp (!) 100.5 F (38.1 C) (Oral)   Resp (!) 23   Ht 6' (1.829 m)   Wt 79.4 kg   SpO2 99%   BMI 23.73 kg/m   Physical Exam Vitals and nursing note reviewed.  Constitutional:      General: He is not in acute distress.    Appearance: He is well-developed.  HENT:     Head: Normocephalic and atraumatic.  Eyes:     Conjunctiva/sclera: Conjunctivae normal.  Cardiovascular:     Rate and Rhythm: Normal rate and regular rhythm.  Pulmonary:     Effort: Pulmonary effort is normal. Tachypnea present. No respiratory distress.  Breath sounds: No stridor. Decreased breath sounds present.  Chest:     Chest wall: Tenderness present.     Comments: Tenderness in the right inferior anterior axilla. Abdominal:     General: There is no distension.  Skin:    General: Skin is warm and dry.  Neurological:     Mental Status: He is alert and oriented to person, place, and time.      ED Results / Procedures / Treatments   Labs (all labs ordered are listed, but only abnormal results are displayed) Labs Reviewed  CBC WITH DIFFERENTIAL/PLATELET - Abnormal; Notable for the following components:      Result Value   WBC 43.2 (*)    Hemoglobin 12.1 (*)    HCT 34.0 (*)    MCV 75.2 (*)    Neutro Abs 37.2 (*)    Monocytes Absolute 4.3 (*)    Abs Immature Granulocytes 0.76 (*)    All other components within normal limits  COMPREHENSIVE METABOLIC PANEL - Abnormal; Notable for the  following components:   CO2 21 (*)    Glucose, Bld 131 (*)    Total Bilirubin 2.8 (*)    All other components within normal limits  RESP PANEL BY RT-PCR (FLU A&B, COVID) ARPGX2  RETICULOCYTES  LIPASE, BLOOD  URINALYSIS, ROUTINE W REFLEX MICROSCOPIC    EKG  Radiology DG Chest Port 1 View  Result Date: 11/21/2020 CLINICAL DATA:  Chest pain, shortness of breath EXAM: PORTABLE CHEST 1 VIEW COMPARISON:  June 2021 FINDINGS: Opacity at the right lung base. No definite pleural effusion. No pneumothorax. Normal heart size. IMPRESSION: Right basilar opacity may reflect pneumonia in the appropriate clinical setting. Electronically Signed   By: Guadlupe Spanish M.D.   On: 11/21/2020 09:45    Procedures Procedures   Medications Ordered in ED Medications  0.45 % sodium chloride infusion ( Intravenous New Bag/Given 11/21/20 0935)  azithromycin (ZITHROMAX) 500 mg in sodium chloride 0.9 % 250 mL IVPB (has no administration in time range)  cefTRIAXone (ROCEPHIN) 1 g in sodium chloride 0.9 % 100 mL IVPB (has no administration in time range)  HYDROmorphone (DILAUDID) injection 1 mg (has no administration in time range)  ketorolac (TORADOL) 15 MG/ML injection 15 mg (15 mg Intravenous Given 11/21/20 0935)  acetaminophen (TYLENOL) tablet 1,000 mg (1,000 mg Oral Given 11/21/20 9417)    ED Course  I have reviewed the triage vital signs and the nursing notes.  Pertinent labs & imaging results that were available during my care of the patient were reviewed by me and considered in my medical decision making (see chart for details).   With differential including acute chest, pneumonia, COVID, hepatobiliary dysfunction patient had labs, x-ray ordered, was placed on monitoring, received fluids, analgesia.   10:49 AM Patient awake and alert, continues to complain of pain, now 5/10, in spite of initial analgesia.  With opacification of the right lower lobe, leukocytosis greater than 40,000, there is concern for  acute chest syndrome.  Given his tachypnea, fever, patient has started antibiotics, will continue to require analgesia, will be admitted for further monitoring, management. MDM Rules/Calculators/A&P MDM Number of Diagnoses or Management Options Acute chest syndrome [facility]): new, needed workup   Amount and/or Complexity of Data Reviewed Clinical lab tests: ordered and reviewed Tests in the radiology section of CPT: ordered and reviewed Tests in the medicine section of CPT: reviewed and ordered Decide to obtain previous medical records or to obtain history from someone other than the patient: yes  Review and summarize past medical records: yes Discuss the patient with other providers: yes Independent visualization of images, tracings, or specimens: yes  Risk of Complications, Morbidity, and/or Mortality Presenting problems: high Diagnostic procedures: high Management options: high  Critical Care Total time providing critical care: < 30 minutes  Patient Progress Patient progress: stable  Final Clinical Impression(s) / ED Diagnoses Final diagnoses:  Acute chest syndrome [facility])     Gerhard Munch, MD 11/21/20 1050

## 2020-11-21 NOTE — H&P (Addendum)
H&P  Patient Demographics:  Charles Mcgee, is a 20 y.o. male  MRN: [id number]   DOB - [birth year]  Admit Date - 11/21/2020  Outpatient Primary MD for the patient is Massie Maroon, FNP  Chief Complaint  Patient presents with  . Cough  . Shortness of Breath      HPI:   [name]  is a 21 y.o. male with a medical history significant for sickle cell disease presents to the ER with complaints of right side pain over the past 2 days.  Patient says that right side pain is not consistent with his typical sickle cell pain crisis.  Patient says that he started feeling generally unwell and underwent a COVID test which was negative.  However he reports a max patient says that he had a fever at home and maximum temperature was 102.  He states that he awakened with increased pain which was unrelieved by home ibuprofen and Percocet.  Patient has infrequent pain crisis and does not consistently take medications for his sickle cell disease.  He is mostly opiate nave.  Current pain intensity is 6/10 characterized as intermittent and aching.  He says that pain mostly occurs on inspiration.  He denies dizziness, paresthesias, no current shortness of breath urinary symptoms, nausea, vomiting, or diarrhea.  He has had no sick contacts, recent travel, or exposure to COVID-19.  Of note, he has been fully vaccinated against COVID.  ER course: Patient presented with right side pain.  He underwent chest x-ray which shows right basilar opacity.  Vital signs mostly normal.  Temperature 98.1 F, BP 99/68, pulse rate 70, oxygen saturation 99% on RA.  Respiratory panel negative for COVID-19 and influenza.  WBC count markedly elevated at 43.2.  Hemoglobin 12.1 and platelets 397.  Total bilirubin 2.8, complete metabolic panel otherwise unremarkable.  Patient admitted for acute chest syndrome in the setting of sickle cell crisis.   Review of systems:  Review of Systems  Constitutional: Negative for chills and fever.   HENT: Negative.   Eyes: Negative.   Respiratory: Negative.  Negative for shortness of breath.   Cardiovascular: Positive for chest pain.  Gastrointestinal: Negative.   Genitourinary: Negative.   Musculoskeletal: Positive for back pain and joint pain.  Skin: Negative.   Neurological: Negative.  Negative for dizziness.  Endo/Heme/Allergies: Negative.   Psychiatric/Behavioral: Negative.  Negative for depression and suicidal ideas.     With Past History of the following :   Past Medical History:  Diagnosis Date  . Sickle cell anemia (HCC)       History reviewed. No pertinent surgical history.   Social History:   Social History   Tobacco Use  . Smoking status: Never Smoker  . Smokeless tobacco: Never Used  Substance Use Topics  . Alcohol use: Yes     Lives - At home   Family History :   No family history on file.   Home Medications:   Prior to Admission medications   Medication Sig Start Date End Date Taking? Authorizing Provider  ergocalciferol (VITAMIN D2) 1.25 MG (50000 UT) capsule Take 1 capsule (50,000 Units total) by mouth once a week. 09/06/20   Massie Maroon, FNP  folic acid (FOLVITE) 1 MG tablet Take 1 tablet (1 mg total) by mouth daily. 02/22/20   Massie Maroon, FNP  ibuprofen (ADVIL) 800 MG tablet Take 1 tablet (800 mg total) by mouth every 8 (eight) hours as needed. 02/22/20   Massie Maroon, FNP  Allergies:   Allergies  Allergen Reactions  . Amoxicillin Itching     Physical Exam:   Vitals:   Vitals:   11/21/20 1120 11/21/20 1145  BP:  108/67  Pulse:  76  Resp:  15  Temp: 99.3 F (37.4 C)   SpO2:  99%    Physical Exam: Constitutional: Patient appears well-developed and well-nourished. Not in obvious distress. HENT: Normocephalic, atraumatic, External right and left ear normal. Oropharynx is clear and moist.  Eyes: Conjunctivae and EOM are normal. PERRLA, no scleral icterus. Neck: Normal ROM. Neck supple. No JVD. No tracheal  deviation. No thyromegaly. CVS: RRR, S1/S2 +, no murmurs, no gallops, no carotid bruit.  Pulmonary: Effort and breath sounds normal, no stridor, rhonchi, wheezes, rales.  Abdominal: Soft. BS +, no distension, tenderness, rebound or guarding.  Musculoskeletal: Normal range of motion. No edema and no tenderness.  Lymphadenopathy: No lymphadenopathy noted, cervical, inguinal or axillary Neuro: Alert. Normal reflexes, muscle tone coordination. No cranial nerve deficit. Skin: Skin is warm and dry. No rash noted. Not diaphoretic. No erythema. No pallor. Psychiatric: Normal mood and affect. Behavior, judgment, thought content normal.   Data Review:   CBC Recent Labs  Lab 11/21/20 0936  WBC 43.2*  HGB 12.1*  HCT 34.0*  PLT 397  MCV 75.2*  MCH 26.8  MCHC 35.6  RDW 14.8  LYMPHSABS 0.8  MONOABS 4.3*  EOSABS 0.0  BASOSABS 0.1   ------------------------------------------------------------------------------------------------------------------  Chemistries  Recent Labs  Lab 11/21/20 0936  NA 136  K 3.5  CL 104  CO2 21*  GLUCOSE 131*  BUN 12  CREATININE 1.19  CALCIUM 9.1  AST 17  ALT 11  ALKPHOS 47  BILITOT 2.8*   ------------------------------------------------------------------------------------------------------------------ estimated creatinine clearance is 106.9 mL/min (by C-G formula based on SCr of 1.19 mg/dL). ------------------------------------------------------------------------------------------------------------------ No results for input(s): TSH, T4TOTAL, T3FREE, THYROIDAB in the last 72 hours.  Invalid input(s): FREET3  Coagulation profile No results for input(s): INR, PROTIME in the last 168 hours. ------------------------------------------------------------------------------------------------------------------- No results for input(s): DDIMER in the last 72  hours. -------------------------------------------------------------------------------------------------------------------  Cardiac Enzymes No results for input(s): CKMB, TROPONINI, MYOGLOBIN in the last 168 hours.  Invalid input(s): CK ------------------------------------------------------------------------------------------------------------------ No results found for: BNP  ---------------------------------------------------------------------------------------------------------------  Urinalysis    Component Value Date/Time   COLORURINE AMBER (A) 01/13/2020 1334   APPEARANCEUR CLEAR 01/13/2020 1334   LABSPEC 1.015 01/13/2020 1334   PHURINE 6.0 01/13/2020 1334   GLUCOSEU NEGATIVE 01/13/2020 1334   HGBUR NEGATIVE 01/13/2020 1334   BILIRUBINUR neg 09/05/2020 1222   KETONESUR 15 (A) 01/13/2020 1334   PROTEINUR Negative 09/05/2020 1222   PROTEINUR NEGATIVE 01/13/2020 1334   UROBILINOGEN 0.2 09/05/2020 1222   NITRITE neg 09/05/2020 1222   NITRITE NEGATIVE 01/13/2020 1334   LEUKOCYTESUR Negative 09/05/2020 1222   LEUKOCYTESUR NEGATIVE 01/13/2020 1334    ----------------------------------------------------------------------------------------------------------------   Imaging Results:    DG Chest Port 1 View  Result Date: 11/21/2020 CLINICAL DATA:  Chest pain, shortness of breath EXAM: PORTABLE CHEST 1 VIEW COMPARISON:  June 2021 FINDINGS: Opacity at the right lung base. No definite pleural effusion. No pneumothorax. Normal heart size. IMPRESSION: Right basilar opacity may reflect pneumonia in the appropriate clinical setting. Electronically Signed   By: Guadlupe Spanish M.D.   On: 11/21/2020 09:45     Assessment & Plan:  Principal Problem:   Acute chest syndrome in sickle crisis [facility]) Active Problems:   Sickle cell pain crisis (HCC)   Leukocytosis   Sickle cell pain crisis with acute  chest syndrome: Chest x-ray shows right lower lobe opacity.  Empiric antibiotics initiated  in ER.  Continue IV ceftriaxone and IV azithromycin. Patient does not have an oxygen requirement at this time, supplemental oxygen as needed Pain control with IV Dilaudid PCA per full dose, patient is opiate nave Toradol 15 mg IV every 6 hours for total of 5 days Monitor vital signs very closely, reevaluate pain scale regularly, and incentive spirometry Patient will be reevaluated for pain in the context of function and relationship to baseline as his care progresses. Does not meet sepsis criteria, monitor closely  Sickle cell anemia: Patient's hemoglobin is 12.1, which appears to be consistent with his baseline.  Continue folic acid 1 mg daily.  Patient is not on any disease modifying agents at this time. Repeat CBC in a.m.  Leukocytosis: WBCs elevated at 43.2.  Empiric antibiotics initiated.  Urine culture and urinalysis pending.  COVID-19 negative. The patient is afebrile at this time.  Follow CBC in AM.  DVT Prophylaxis: Subcut Lovenox   AM Labs Ordered, also please review Full Orders  Family Communication: Admission, patient's condition and plan of care including tests being ordered have been discussed with the patient who indicate understanding and agree with the plan and Code Status.  Code Status: Full Code  Consults called: None    Admission status: Inpatient    Time spent in minutes : 35 minutes  Nolon Nations  APRN, MSN, FNP-C Patient Care [facility] [address] [place], [place] [address] [phone]  11/21/2020 at 12:41 PM

## 2020-11-21 NOTE — ED Notes (Signed)
Pt provided with urinal and sample requested

## 2020-11-21 NOTE — Telephone Encounter (Signed)
Patient called requesting to come to the day hospital for sickle cell pain. Patient reports "trouble" breathing and back and rib pain rated 7/10. Reports taking Motrin two hours ago. Patient also reports recent fever of 102, chest pain and vomiting. [place], FNP notified and advised that patient report to the ER for evaluation of symptoms. Patient will likely need a chest x-ray to rule out acute chest. Patient advised and expresses an understanding.

## 2020-11-21 NOTE — ED Triage Notes (Signed)
Pt presents to ED c/o cough, SHOB, right sided rib pain esp w/inspiration starting night before last. Hx sickle cell. Roommate sick recently with covid. 2 vax, no boosters. Fever 102 last night, last dose motrin 0600. One episode emesis. Endorses occasional lightheadedness and loss of appetite.

## 2020-11-22 ENCOUNTER — Inpatient Hospital Stay (HOSPITAL_COMMUNITY): Payer: Managed Care, Other (non HMO)

## 2020-11-22 ENCOUNTER — Encounter (HOSPITAL_COMMUNITY): Payer: Self-pay | Admitting: Family Medicine

## 2020-11-22 DIAGNOSIS — D5701 Hb-SS disease with acute chest syndrome: Secondary | ICD-10-CM | POA: Diagnosis not present

## 2020-11-22 LAB — LACTATE DEHYDROGENASE: LDH: 216 U/L — ABNORMAL HIGH (ref 98–192)

## 2020-11-22 LAB — HEPATIC FUNCTION PANEL
ALT: 12 U/L (ref 0–44)
AST: 19 U/L (ref 15–41)
Albumin: 3.7 g/dL (ref 3.5–5.0)
Alkaline Phosphatase: 60 U/L (ref 38–126)
Bilirubin, Direct: 0.7 mg/dL — ABNORMAL HIGH (ref 0.0–0.2)
Indirect Bilirubin: 2.3 mg/dL — ABNORMAL HIGH (ref 0.3–0.9)
Total Bilirubin: 3 mg/dL — ABNORMAL HIGH (ref 0.3–1.2)
Total Protein: 7.4 g/dL (ref 6.5–8.1)

## 2020-11-22 LAB — TYPE AND SCREEN
ABO/RH(D): B POS
Antibody Screen: NEGATIVE

## 2020-11-22 LAB — BASIC METABOLIC PANEL
Anion gap: 10 (ref 5–15)
BUN: 14 mg/dL (ref 6–20)
CO2: 23 mmol/L (ref 22–32)
Calcium: 8.5 mg/dL — ABNORMAL LOW (ref 8.9–10.3)
Chloride: 100 mmol/L (ref 98–111)
Creatinine, Ser: 1.34 mg/dL — ABNORMAL HIGH (ref 0.61–1.24)
GFR, Estimated: >60 mL/min (ref >60)
Glucose, Bld: 105 mg/dL — ABNORMAL HIGH (ref 70–99)
Potassium: 4.9 mmol/L (ref 3.5–5.1)
Sodium: 133 mmol/L — ABNORMAL LOW (ref 135–145)

## 2020-11-22 LAB — CBC
HCT: 36.4 % — ABNORMAL LOW (ref 39.0–52.0)
Hemoglobin: 12.8 g/dL — ABNORMAL LOW (ref 13.0–17.0)
MCH: 26.8 pg (ref 26.0–34.0)
MCHC: 35.2 g/dL (ref 30.0–36.0)
MCV: 76.2 fL — ABNORMAL LOW (ref 80.0–100.0)
Platelets: 390 10*3/uL (ref 150–400)
RBC: 4.78 MIL/uL (ref 4.22–5.81)
RDW: 15.3 % (ref 11.5–15.5)
WBC: 59.3 10*3/uL (ref 4.0–10.5)
nRBC: 0.1 % (ref 0.0–0.2)

## 2020-11-22 LAB — MRSA PCR SCREENING: MRSA by PCR: NEGATIVE

## 2020-11-22 LAB — STREP PNEUMONIAE URINARY ANTIGEN: Strep Pneumo Urinary Antigen: NEGATIVE

## 2020-11-22 MED ORDER — IOHEXOL 350 MG/ML SOLN
100.0000 mL | Freq: Once | INTRAVENOUS | Status: AC | PRN
  Administered 2020-11-22: 75 mL via INTRAVENOUS

## 2020-11-22 MED ORDER — OXYCODONE-ACETAMINOPHEN 5-325 MG PO TABS
1.0000 | ORAL_TABLET | Freq: Four times a day (QID) | ORAL | Status: DC | PRN
Start: 2020-11-22 — End: 2020-11-25
  Administered 2020-11-23 – 2020-11-25 (×7): 1 via ORAL
  Filled 2020-11-22 (×7): qty 1

## 2020-11-22 MED ORDER — ACETAMINOPHEN 325 MG PO TABS
650.0000 mg | ORAL_TABLET | ORAL | Status: DC | PRN
  Administered 2020-11-22 – 2020-11-24 (×9): 650 mg via ORAL
  Filled 2020-11-22 (×9): qty 2

## 2020-11-22 MED ORDER — SODIUM CHLORIDE 0.45 % IV SOLN: INTRAVENOUS | Status: DC

## 2020-11-22 MED ORDER — SODIUM CHLORIDE 0.9 % IV SOLN
2.0000 g | Freq: Three times a day (TID) | INTRAVENOUS | Status: DC
  Administered 2020-11-22 – 2020-11-25 (×10): 2 g via INTRAVENOUS
  Filled 2020-11-22 (×11): qty 2

## 2020-11-22 NOTE — Progress Notes (Signed)
   11/22/20 0828  Assess: MEWS Score  Temp (!) 102.9 F (39.4 C) (RN notified)  BP 127/79  Pulse Rate 100  Resp 20  SpO2 96 %  O2 Device Room Air  Assess: MEWS Score  MEWS Temp 2  MEWS Systolic 0  MEWS Pulse 0  MEWS RR 0  MEWS LOC 0  MEWS Score 2  MEWS Score Color Yellow  Assess: if the MEWS score is Yellow or Red  Were vital signs taken at a resting state? Yes  Focused Assessment No change from prior assessment  Early Detection of Sepsis Score *See Row Information* Medium  MEWS guidelines implemented *See Row Information* No, other (Comment) (NP made aware, See previous orders. PRN administered)  Treat  MEWS Interventions Administered prn meds/treatments  Pain Scale 0-10  Pain Score 0  Notify: Provider  Provider Name/Title [place] Hollis NP  Date Provider Notified 11/22/20  Notification Type Call  Document  Patient Outcome Stabilized after interventions

## 2020-11-22 NOTE — Progress Notes (Signed)
Subjective: Charles Mcgee is a 20 year old male with a medical history significant for sickle cell disease that was admitted for community-acquired pneumonia in the setting of sickle cell pain crisis.  Patient continues to complain of right side pain and increased pain on inspiration.  He underwent CTA this a.m. which showed no evidence of pulmonary embolism, sizable right pleural effusion with small left pleural effusion.  Airspace opacity consistent with pneumonia throughout much of the right middle and lower lobes.  Pneumonia visualized with localized consolidation also noted in the posterior segment of the left upper lobe.  There is also left base atelectasis.  Patient was periodically febrile overnight, maximum temperature 102.9. Patient said that pain has improved overnight.  He is currently rating pain as 3/10.  He is ambulating around the room.  Patient is using incentive spirometer.  He does not have an oxygen requirement at this time.  Oxygen saturation is 98% on RA.  Objective:  Vital signs in last 24 hours:  Vitals:   11/22/20 0828 11/22/20 1125 11/22/20 1228 11/22/20 1243  BP: 127/79  123/76   Pulse: 100  (!) 106   Resp: 20  20 17   Temp: (!) 102.9 F (39.4 C) 98.8 F (37.1 C) 100 F (37.8 C)   TempSrc: Oral Oral Oral   SpO2: 96%  98% 93%  Weight:      Height:        Intake/Output from previous day:   Intake/Output Summary (Last 24 hours) at 11/22/2020 1327 Last data filed at 11/22/2020 1125 Gross per 24 hour  Intake 840 ml  Output 1650 ml  Net -810 ml    Physical Exam: General: Alert, awake, oriented x3, in no acute distress.  HEENT: [place]/AT PEERL, EOMI Neck: Trachea midline,  no masses, no thyromegal,y no JVD, no carotid bruit OROPHARYNX:  Moist, No exudate/ erythema/lesions.  Heart: Regular rate and rhythm, without murmurs, rubs, gallops, PMI non-displaced, no heaves or thrills on palpation.  Lungs: Clear to auscultation, no wheezing or rhonchi noted. No  increased vocal fremitus resonant to percussion  Abdomen: Soft, nontender, nondistended, positive bowel sounds, no masses no hepatosplenomegaly noted..  Neuro: No focal neurological deficits noted cranial nerves II through XII grossly intact. DTRs 2+ bilaterally upper and lower extremities. Strength 5 out of 5 in bilateral upper and lower extremities. Musculoskeletal: No warm swelling or erythema around joints, no spinal tenderness noted. Psychiatric: Patient alert and oriented x3, good insight and cognition, good recent to remote recall. Lymph node survey: No cervical axillary or inguinal lymphadenopathy noted.  Lab Results:  Basic Metabolic Panel:    Component Value Date/Time   NA 133 (L) 11/22/2020 0508   NA 138 09/05/2020 1211   K 4.9 11/22/2020 0508   CL 100 11/22/2020 0508   CO2 23 11/22/2020 0508   BUN 14 11/22/2020 0508   BUN 10 09/05/2020 1211   CREATININE 1.34 (H) 11/22/2020 0508   GLUCOSE 105 (H) 11/22/2020 0508   CALCIUM 8.5 (L) 11/22/2020 0508   CBC:    Component Value Date/Time   WBC 59.3 (HH) 11/22/2020 0508   HGB 12.8 (L) 11/22/2020 0508   HGB 11.9 (L) 09/05/2020 1211   HCT 36.4 (L) 11/22/2020 0508   HCT 36.5 (L) 09/05/2020 1211   PLT 390 11/22/2020 0508   PLT 310 09/05/2020 1211   MCV 76.2 (L) 11/22/2020 0508   MCV 80 09/05/2020 1211   NEUTROABS 37.2 (H) 11/21/2020 0936   NEUTROABS 4.1 09/05/2020 1211   LYMPHSABS 0.8 11/21/2020 0936  LYMPHSABS 2.9 09/05/2020 1211   MONOABS 4.3 (H) 11/21/2020 0936   EOSABS 0.0 11/21/2020 0936   EOSABS 0.2 09/05/2020 1211   BASOSABS 0.1 11/21/2020 0936   BASOSABS 0.1 09/05/2020 1211    Recent Results (from the past 240 hour(s))  Resp Panel by RT-PCR (Flu A&B, Covid) Nasopharyngeal Swab     Status: None   Collection Time: 11/21/20  9:36 AM   Specimen: Nasopharyngeal Swab; Nasopharyngeal(NP) swabs in vial transport medium  Result Value Ref Range Status   SARS Coronavirus 2 by RT PCR NEGATIVE NEGATIVE Final    Comment:  (NOTE) SARS-CoV-2 target nucleic acids are NOT DETECTED.  The SARS-CoV-2 RNA is generally detectable in upper respiratory specimens during the acute phase of infection. The lowest concentration of SARS-CoV-2 viral copies this assay can detect is 138 copies/mL. A negative result does not preclude SARS-Cov-2 infection and should not be used as the sole basis for treatment or other patient management decisions. A negative result may occur with  improper specimen collection/handling, submission of specimen other than nasopharyngeal swab, presence of viral mutation(s) within the areas targeted by this assay, and inadequate number of viral copies(<138 copies/mL). A negative result must be combined with clinical observations, patient history, and epidemiological information. The expected result is Negative.  Fact Sheet for Patients:  BloggerCourse.com  Fact Sheet for Healthcare Providers:  SeriousBroker.it  This test is no t yet approved or cleared by the [place] FDA and  has been authorized for detection and/or diagnosis of SARS-CoV-2 by FDA under an Emergency Use Authorization (EUA). This EUA will remain  in effect (meaning this test can be used) for the duration of the COVID-19 declaration under Section 564(b)(1) of the Act, 21 U.S.C.section 360bbb-3(b)(1), unless the authorization is terminated  or revoked sooner.       Influenza A by PCR NEGATIVE NEGATIVE Final   Influenza B by PCR NEGATIVE NEGATIVE Final    Comment: (NOTE) The Xpert Xpress SARS-CoV-2/FLU/RSV plus assay is intended as an aid in the diagnosis of influenza from Nasopharyngeal swab specimens and should not be used as a sole basis for treatment. Nasal washings and aspirates are unacceptable for Xpert Xpress SARS-CoV-2/FLU/RSV testing.  Fact Sheet for Patients: BloggerCourse.com  Fact Sheet for Healthcare  Providers: SeriousBroker.it  This test is not yet approved or cleared by the [place] FDA and has been authorized for detection and/or diagnosis of SARS-CoV-2 by FDA under an Emergency Use Authorization (EUA). This EUA will remain in effect (meaning this test can be used) for the duration of the COVID-19 declaration under Section 564(b)(1) of the Act, 21 U.S.C. section 360bbb-3(b)(1), unless the authorization is terminated or revoked.  Performed at [facility], [address] [place], [place] [address]   MRSA PCR Screening     Status: None   Collection Time: 11/22/20  6:25 AM   Specimen: Nasopharyngeal  Result Value Ref Range Status   MRSA by PCR NEGATIVE NEGATIVE Final    Comment:        The GeneXpert MRSA Assay (FDA approved for NASAL specimens only), is one component of a comprehensive MRSA colonization surveillance program. It is not intended to diagnose MRSA infection nor to guide or monitor treatment for MRSA infections. Performed at [facility], [address], [place], [place] [address]     Studies/Results: CT ANGIO CHEST PE W OR WO CONTRAST  Result Date: 11/22/2020 CLINICAL DATA:  Chest pain.  History of sickle cell disease EXAM: CT ANGIOGRAPHY  CHEST WITH CONTRAST TECHNIQUE: Multidetector CT imaging of the chest was performed using the standard protocol during bolus administration of intravenous contrast. Multiplanar CT image reconstructions and MIPs were obtained to evaluate the vascular anatomy. CONTRAST:  75 mL OMNIPAQUE IOHEXOL 350 MG/ML SOLN COMPARISON:  Chest radiograph November 21, 2020 FINDINGS: Cardiovascular: There is no demonstrable pulmonary embolus. There is no thoracic aortic aneurysm or dissection. Visualized great vessels appear unremarkable. There is no pericardial effusion or pericardial thickening. Mediastinum/Nodes: Thyroid appears normal. There is a lymph node in the subcarinal region  measuring 1.5 x 1.2 cm. No other adenopathy evident in the thoracic region. No esophageal lesions are evident. Lungs/Pleura: There is a sizable right pleural effusion with a much smaller left pleural effusion. Fluid tracks along the right major and minor fissures. There is extensive airspace opacity throughout much of the right middle and lower lobes. There is also airspace opacity in the posterior segment of the left upper lobe. There is atelectatic change in the left base. The trachea and major bronchial structures appear patent. No pneumothorax. Upper Abdomen: There is incomplete visualization of an apparent enlarged lymph node immediately to the right of the inferior vena cava at the upper kidney level measuring 2.1 x 1.8 cm. Visualized upper abdominal structures otherwise appear unremarkable. Musculoskeletal: Bones are diffusely sclerotic consistent with known sickle cell disease. There are several endplate infarcts in the thoracic region. 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 No chest wall lesions. Review of the MIP images confirms the above findings. IMPRESSION: 1. No evident pulmonary embolus. No thoracic aortic aneurysm or dissection. 2. Sizable right pleural effusion with small left pleural effusion. Airspace opacity consistent with pneumonia throughout much of the right middle and lower lobes. Pneumonia with localized consolidation also noted in the posterior segment left upper lobe. There is left base atelectasis. 3. Incomplete visualization of suspected enlarged retroperitoneal adjacent to the inferior vena cava. Enlarged subcarinal lymph node evident. 4.  Bony changes indicative of sickle cell disease. Electronically Signed   By: Bretta BangWilliam  Woodruff III M.D.   On: 11/22/2020 12:16   DG Chest Port 1 View  Result Date: 11/21/2020 CLINICAL DATA:  Chest pain, shortness of breath EXAM: PORTABLE CHEST 1 VIEW COMPARISON:  June 2021 FINDINGS: Opacity at the right lung base. No  definite pleural effusion. No pneumothorax. Normal heart size. IMPRESSION: Right basilar opacity may reflect pneumonia in the appropriate clinical setting. Electronically Signed   By: Guadlupe SpanishPraneil  Patel M.D.   On: 11/21/2020 09:45    Medications: Scheduled Meds: . enoxaparin (LOVENOX) injection  40 mg Subcutaneous Q24H  . folic acid  1 mg Oral Daily  . HYDROmorphone   Intravenous Q4H  . senna-docusate  1 tablet Oral BID   Continuous Infusions: . sodium chloride 150 mL/hr at 11/22/20 0639  . azithromycin 500 mg (11/22/20 1022)  . ceFEPime (MAXIPIME) IV 2 g (11/22/20 0751)   PRN Meds:.acetaminophen, diphenhydrAMINE **OR** diphenhydrAMINE, guaiFENesin, naloxone **AND** sodium chloride flush, ondansetron (ZOFRAN) IV, polyethylene glycol  Consultants:  None  Procedures:  None  Antibiotics:  IV Azithromycin  IV Cefepime  Assessment/Plan: Principal Problem:   Acute chest syndrome in sickle crisis (HCC) Active Problems:   Sickle cell pain crisis (HCC)   Leukocytosis  Community-acquired pneumonia: Patient was periodically febrile overnight.  IV antibiotics changed to cefepime per pharmacy consult.  Also, continue azithromycin.  MRSA per PCR negative.  Strep pneumo urinary antigen pending. Patient does not have an oxygen requirement at this time.  Oxygen saturation is 98% on room air.  Supplemental oxygen as needed Tylenol 650 mg every 4 hours as needed for fever Continue incentive spirometry  Sickle cell disease with pain crisis: Continue IV Dilaudid PCA per full dose Percocet 5-325 mg every 6 hours as needed for severe breakthrough pain Hold Toradol due to acute kidney injury Monitor vital signs very closely, reevaluate pain scale regularly, and supplemental oxygen as needed.  Acute kidney injury: Creatinine elevated at 1.34.  Discontinue Toradol.  Gentle hydration.  Repeat creatinine level in AM.  Leukocytosis: Marked leukocytosis.  WBCs elevated at 59.3.  CTA confirms  right middle and lower lobe pneumonia with consolidation.  Blood cultures pending.  Will continue IV antibiotics.  Continue to follow very closely.  Sickle cell anemia: Patient's hemoglobin is 12.8, which is consistent with his baseline.  Review LDH as results become available.  There is no clinical indication for blood transfusion at this time.  Continue to follow closely.    Code Status: Full Code Family Communication: N/A Disposition Plan: Not yet ready for discharge  Olisa Quesnel Rennis Petty  APRN, MSN, FNP-C [facility] [address] [place], [place] [address] [phone]  If 5PM-8AM, please contact night-coverage.  11/22/2020, 1:27 PM  LOS: 1 day

## 2020-11-22 NOTE — Progress Notes (Signed)
PHARMACY NOTE -  Cefepime  Pharmacy has been assisting with dosing of cefepim for PNA vs acute chest in [place] disease.  Dosage remains stable at 2g IV q8 hr and further renal adjustments per institutional Pharmacy antibiotic protocol  Pharmacy will sign off, following peripherally for culture results or dose adjustments. Please reconsult if a change in clinical status warrants re-evaluation of dosage.  Bernadene Person, PharmD, BCPS [phone] 11/22/2020, 6:40 AM

## 2020-11-23 LAB — CBC
HCT: 29.1 % — ABNORMAL LOW (ref 39.0–52.0)
Hemoglobin: 10.7 g/dL — ABNORMAL LOW (ref 13.0–17.0)
MCH: 27.2 pg (ref 26.0–34.0)
MCHC: 36.8 g/dL — ABNORMAL HIGH (ref 30.0–36.0)
MCV: 74 fL — ABNORMAL LOW (ref 80.0–100.0)
Platelets: 318 10*3/uL (ref 150–400)
RBC: 3.93 MIL/uL — ABNORMAL LOW (ref 4.22–5.81)
RDW: 14.9 % (ref 11.5–15.5)
WBC: 54.5 10*3/uL (ref 4.0–10.5)
nRBC: 0.1 % (ref 0.0–0.2)

## 2020-11-23 LAB — URINE CULTURE: Culture: NO GROWTH

## 2020-11-23 LAB — BASIC METABOLIC PANEL
Anion gap: 12 (ref 5–15)
BUN: 11 mg/dL (ref 6–20)
CO2: 20 mmol/L — ABNORMAL LOW (ref 22–32)
Calcium: 8.4 mg/dL — ABNORMAL LOW (ref 8.9–10.3)
Chloride: 103 mmol/L (ref 98–111)
Creatinine, Ser: 1.03 mg/dL (ref 0.61–1.24)
GFR, Estimated: >60 mL/min (ref >60)
Glucose, Bld: 109 mg/dL — ABNORMAL HIGH (ref 70–99)
Potassium: 3.7 mmol/L (ref 3.5–5.1)
Sodium: 135 mmol/L (ref 135–145)

## 2020-11-23 MED ORDER — HYDROMORPHONE HCL 1 MG/ML IJ SOLN: 1.0000 mg | INTRAMUSCULAR | Status: DC | PRN

## 2020-11-23 NOTE — Progress Notes (Signed)
Subjective: Charles Mcgee is a 20 year old male with a medical history significant for sickle cell disease that was admitted for community-acquired pneumonia in the setting of sickle cell pain crisis.  Patient says that pain has improved. He continues to have minimal right sided pain with inspiration. He has been periodically febrile overnight and WBCs continue to be markedly elevated.  Patient is ambulating in room. He does not have an oxygen requirement. He denies any headache, shortness of breath, urinary symptoms, nausea, vomiting, or diarrhea.  Objective:  Vital signs in last 24 hours:  Vitals:   11/23/20 1009 11/23/20 1333 11/23/20 1504 11/23/20 1717  BP: 119/60 136/78  116/86  Pulse: 91 91  97  Resp: 16 15  15   Temp: 99.5 F (37.5 C) 99.5 F (37.5 C) (!) 102.7 F (39.3 C) 99.7 F (37.6 C)  TempSrc: Oral Oral  Oral  SpO2: 99% 98%  98%  Weight:      Height:        Intake/Output from previous day:   Intake/Output Summary (Last 24 hours) at 11/23/2020 1756 Last data filed at 11/23/2020 1727 Gross per 24 hour  Intake 1269 ml  Output 2400 ml  Net -1131 ml    Physical Exam: General: Alert, awake, oriented x3, in no acute distress.  HEENT: [place]/AT PEERL, EOMI Neck: Trachea midline,  no masses, no thyromegal,y no JVD, no carotid bruit OROPHARYNX:  Moist, No exudate/ erythema/lesions.  Heart: Regular rate and rhythm, without murmurs, rubs, gallops, PMI non-displaced, no heaves or thrills on palpation.  Lungs: Clear to auscultation, no wheezing or rhonchi noted. No increased vocal fremitus resonant to percussion  Abdomen: Soft, nontender, nondistended, positive bowel sounds, no masses no hepatosplenomegaly noted..  Neuro: No focal neurological deficits noted cranial nerves II through XII grossly intact. DTRs 2+ bilaterally upper and lower extremities. Strength 5 out of 5 in bilateral upper and lower extremities. Musculoskeletal: No warm swelling or erythema around joints, no  spinal tenderness noted. Psychiatric: Patient alert and oriented x3, good insight and cognition, good recent to remote recall. Lymph node survey: No cervical axillary or inguinal lymphadenopathy noted.  Lab Results:  Basic Metabolic Panel:    Component Value Date/Time   NA 135 11/23/2020 0542   NA 138 09/05/2020 1211   K 3.7 11/23/2020 0542   CL 103 11/23/2020 0542   CO2 20 (L) 11/23/2020 0542   BUN 11 11/23/2020 0542   BUN 10 09/05/2020 1211   CREATININE 1.03 11/23/2020 0542   GLUCOSE 109 (H) 11/23/2020 0542   CALCIUM 8.4 (L) 11/23/2020 0542   CBC:    Component Value Date/Time   WBC 54.5 (HH) 11/23/2020 0542   HGB 10.7 (L) 11/23/2020 0542   HGB 11.9 (L) 09/05/2020 1211   HCT 29.1 (L) 11/23/2020 0542   HCT 36.5 (L) 09/05/2020 1211   PLT 318 11/23/2020 0542   PLT 310 09/05/2020 1211   MCV 74.0 (L) 11/23/2020 0542   MCV 80 09/05/2020 1211   NEUTROABS 37.2 (H) 11/21/2020 0936   NEUTROABS 4.1 09/05/2020 1211   LYMPHSABS 0.8 11/21/2020 0936   LYMPHSABS 2.9 09/05/2020 1211   MONOABS 4.3 (H) 11/21/2020 0936   EOSABS 0.0 11/21/2020 0936   EOSABS 0.2 09/05/2020 1211   BASOSABS 0.1 11/21/2020 0936   BASOSABS 0.1 09/05/2020 1211    Recent Results (from the past 240 hour(s))  Resp Panel by RT-PCR (Flu A&B, Covid) Nasopharyngeal Swab     Status: None   Collection Time: 11/21/20  9:36 AM  Specimen: Nasopharyngeal Swab; Nasopharyngeal(NP) swabs in vial transport medium  Result Value Ref Range Status   SARS Coronavirus 2 by RT PCR NEGATIVE NEGATIVE Final    Comment: (NOTE) SARS-CoV-2 target nucleic acids are NOT DETECTED.  The SARS-CoV-2 RNA is generally detectable in upper respiratory specimens during the acute phase of infection. The lowest concentration of SARS-CoV-2 viral copies this assay can detect is 138 copies/mL. A negative result does not preclude SARS-Cov-2 infection and should not be used as the sole basis for treatment or other patient management decisions. A  negative result may occur with  improper specimen collection/handling, submission of specimen other than nasopharyngeal swab, presence of viral mutation(s) within the areas targeted by this assay, and inadequate number of viral copies(<138 copies/mL). A negative result must be combined with clinical observations, patient history, and epidemiological information. The expected result is Negative.  Fact Sheet for Patients:  BloggerCourse.comhttps://www.fda.gov/media/152166/download  Fact Sheet for Healthcare Providers:  SeriousBroker.ithttps://www.fda.gov/media/152162/download  This test is no t yet approved or cleared by the [place] FDA and  has been authorized for detection and/or diagnosis of SARS-CoV-2 by FDA under an Emergency Use Authorization (EUA). This EUA will remain  in effect (meaning this test can be used) for the duration of the COVID-19 declaration under Section 564(b)(1) of the Act, 21 U.S.C.section 360bbb-3(b)(1), unless the authorization is terminated  or revoked sooner.       Influenza A by PCR NEGATIVE NEGATIVE Final   Influenza B by PCR NEGATIVE NEGATIVE Final    Comment: (NOTE) The Xpert Xpress SARS-CoV-2/FLU/RSV plus assay is intended as an aid in the diagnosis of influenza from Nasopharyngeal swab specimens and should not be used as a sole basis for treatment. Nasal washings and aspirates are unacceptable for Xpert Xpress SARS-CoV-2/FLU/RSV testing.  Fact Sheet for Patients: BloggerCourse.comhttps://www.fda.gov/media/152166/download  Fact Sheet for Healthcare Providers: SeriousBroker.ithttps://www.fda.gov/media/152162/download  This test is not yet approved or cleared by the [place] FDA and has been authorized for detection and/or diagnosis of SARS-CoV-2 by FDA under an Emergency Use Authorization (EUA). This EUA will remain in effect (meaning this test can be used) for the duration of the COVID-19 declaration under Section 564(b)(1) of the Act, 21 U.S.C. section 360bbb-3(b)(1), unless the authorization  is terminated or revoked.  Performed at [facility], [address] [place], [place] [address]   Urine culture     Status: None   Collection Time: 11/21/20 11:22 PM   Specimen: Urine, Random  Result Value Ref Range Status   Specimen Description   Final    URINE, RANDOM Performed at [facility], [address] [place], [place] [address]    Special Requests   Final    NONE Performed at [facility], [address] [place], [place] [address]    Culture   Final    NO GROWTH Performed at [facility], [address] [place], [place] [address]    Report Status 11/23/2020 FINAL  Final  MRSA PCR Screening     Status: None   Collection Time: 11/22/20  6:25 AM   Specimen: Nasopharyngeal  Result Value Ref Range Status   MRSA by PCR NEGATIVE NEGATIVE Final    Comment:        The GeneXpert MRSA Assay (FDA approved for NASAL specimens only), is one component of a comprehensive MRSA colonization surveillance program. It is not intended to diagnose MRSA infection nor to guide or monitor treatment for MRSA infections. Performed at [facility], [address] [place], [place] [address]   Culture, blood (Routine X 2) w Reflex to ID Panel     Status: None (Preliminary result)   Collection Time: 11/22/20  8:10 AM   Specimen: BLOOD  Result Value Ref Range Status   Specimen Description   Final    BLOOD LEFT ANTECUBITAL Performed at [facility], [address], [place], [place] [address]    Special Requests   Final    BOTTLES DRAWN AEROBIC AND ANAEROBIC Blood Culture adequate volume Performed at [facility], [address], [place], [place] [address]    Culture   Final    NO GROWTH < 24 HOURS Performed at [facility], [address], [place], [place] [address]    Report Status PENDING  Incomplete  Culture, blood (Routine X 2) w Reflex to ID Panel      Status: None (Preliminary result)   Collection Time: 11/22/20  8:10 AM   Specimen: BLOOD  Result Value Ref Range Status   Specimen Description   Final    BLOOD RIGHT ANTECUBITAL Performed at [facility], [address], [place], [place] [address]    Special Requests   Final    BOTTLES DRAWN AEROBIC AND ANAEROBIC Blood Culture adequate volume Performed at [facility], [address], [place], [place] [address]    Culture   Final    NO GROWTH < 24 HOURS Performed at [facility], [address] [place], [place] [address]    Report Status PENDING  Incomplete    Studies/Results: CT ANGIO CHEST PE W OR WO CONTRAST  Result Date: 11/22/2020 CLINICAL DATA:  Chest pain.  History of sickle cell disease EXAM: CT ANGIOGRAPHY CHEST WITH CONTRAST TECHNIQUE: Multidetector CT imaging of the chest was performed using the standard protocol during bolus administration of intravenous contrast. Multiplanar CT image reconstructions and MIPs were obtained to evaluate the vascular anatomy. CONTRAST:  75 mL OMNIPAQUE IOHEXOL 350 MG/ML SOLN COMPARISON:  Chest radiograph November 21, 2020 FINDINGS: Cardiovascular: There is no demonstrable pulmonary embolus. There is no thoracic aortic aneurysm or dissection. Visualized great vessels appear unremarkable. There is no pericardial effusion or pericardial thickening. Mediastinum/Nodes: Thyroid appears normal. There is a lymph node in the subcarinal region measuring 1.5 x 1.2 cm. No other adenopathy evident in the thoracic region. No esophageal lesions are evident. Lungs/Pleura: There is a sizable right pleural effusion with a much smaller left pleural effusion. Fluid tracks along the right major and minor fissures. There is extensive airspace opacity throughout much of the right middle and lower lobes. There is also airspace opacity in the posterior segment of the left upper lobe. There is atelectatic change in the left base. The  trachea and major bronchial structures appear patent. No pneumothorax. Upper Abdomen: There is incomplete visualization of an apparent enlarged lymph node immediately to the right of the inferior vena cava at the upper kidney level measuring 2.1 x 1.8 cm. Visualized upper abdominal structures otherwise appear unremarkable. Musculoskeletal: Bones are diffusely sclerotic consistent with known sickle cell disease. There are several endplate infarcts in the thoracic region. 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 No chest wall lesions. Review of the MIP images confirms the above findings. IMPRESSION: 1. No evident pulmonary embolus. No thoracic aortic aneurysm or dissection. 2. Sizable right pleural effusion with small left pleural effusion. Airspace opacity consistent with pneumonia throughout much of  the right middle and lower lobes. Pneumonia with localized consolidation also noted in the posterior segment left upper lobe. There is left base atelectasis. 3. Incomplete visualization of suspected enlarged retroperitoneal adjacent to the inferior vena cava. Enlarged subcarinal lymph node evident. 4.  Bony changes indicative of sickle cell disease. Electronically Signed   By: Bretta Bang III M.D.   On: 11/22/2020 12:16    Medications: Scheduled Meds: . enoxaparin (LOVENOX) injection  40 mg Subcutaneous Q24H  . folic acid  1 mg Oral Daily  . senna-docusate  1 tablet Oral BID   Continuous Infusions: . sodium chloride 50 mL/hr at 11/23/20 1341  . azithromycin 500 mg (11/23/20 1010)  . ceFEPime (MAXIPIME) IV 2 g (11/23/20 1341)   PRN Meds:.acetaminophen, guaiFENesin, HYDROmorphone (DILAUDID) injection, oxyCODONE-acetaminophen, polyethylene glycol  Consultants:  None  Procedures:  None  Antibiotics:  IV Azithromycin  IV Cefepime  Assessment/Plan: Principal Problem:   Acute chest syndrome in sickle crisis (HCC) Active Problems:   Sickle cell pain crisis  (HCC)   Leukocytosis  Community-acquired pneumonia: Continue IV antibiotics Both strep pneumo urinary antigen and MRSA per PCR negative. Patient does not have an oxygen requirement at this time.  Oxygen saturation is 96% on room air.  Supplemental oxygen as needed Tylenol 650 mg every 4 hours as needed for fever Continue incentive spirometry  Sickle cell disease with pain crisis: Discontinue IV dilaudid PCA Dilaudid 1 mg every 6 hours as needed Percocet 5-325 mg every 6 hours as needed for severe breakthrough pain Hold Toradol due to acute kidney injury Monitor vital signs very closely, reevaluate pain scale regularly, and supplemental oxygen as needed.  Acute kidney injury: Resolved. Creatinine is within normal limits. Continue to hold Toradol. Follow labs in am.   Leukocytosis: Marked leukocytosis., but improved from previous. CTA confirms right middle and lower lobe pneumonia with consolidation.  Blood cultures continue to be negative.  Will continue IV antibiotics.  Continue to follow very closely.  Sickle cell anemia: Patient's hemoglobin is stable and  consistent with his baseline.  There is no clinical indication for blood transfusion at this time.  Continue to follow closely.    Code Status: Full Code Family Communication: N/A Disposition Plan: Not yet ready for discharge  Patsy Varma Rennis Petty  APRN, MSN, FNP-C [facility] [address] [place], [place] [phone]  If 5PM-8AM, please contact night-coverage.  11/23/2020, 5:56 PM  LOS: 2 days

## 2020-11-23 NOTE — Progress Notes (Signed)
Patient temp is 103.1, He is resting, PRN Tylenol given. ON call provider notified, T. Opyd. MD. Will continue to monitor him.

## 2020-11-24 LAB — CBC
HCT: 31.1 % — ABNORMAL LOW (ref 39.0–52.0)
Hemoglobin: 11.2 g/dL — ABNORMAL LOW (ref 13.0–17.0)
MCH: 26.6 pg (ref 26.0–34.0)
MCHC: 36 g/dL (ref 30.0–36.0)
MCV: 73.9 fL — ABNORMAL LOW (ref 80.0–100.0)
Platelets: 347 10*3/uL (ref 150–400)
RBC: 4.21 MIL/uL — ABNORMAL LOW (ref 4.22–5.81)
RDW: 15.2 % (ref 11.5–15.5)
WBC: 47.3 10*3/uL — ABNORMAL HIGH (ref 4.0–10.5)
nRBC: 0 % (ref 0.0–0.2)

## 2020-11-24 NOTE — Progress Notes (Signed)
. Subjective: Charles Mcgee is a 20 year old male with a medical history significant for sickle cell disease that was admitted for community-acquired pneumonia in the setting of sickle cell pain crisis.  Patient says that he is have been minimal pain on today.  Pain is localized to right chest.  Patient does not have an oxygen requirement.  Patient has been febrile overnight, maximum temperature 102 F.  He denies any shortness of breath, headache, dizziness, urinary symptoms, nausea, vomiting, or diarrhea.  Objective:  Vital signs in last 24 hours:  Vitals:   11/24/20 0938 11/24/20 1351 11/24/20 1548 11/24/20 1734  BP: 123/81 120/76  114/76  Pulse: 86 91  79  Resp: 15 16  17   Temp: 99.3 F (37.4 C) (!) 100.5 F (38.1 C) 99 F (37.2 C) 98.8 F (37.1 C)  TempSrc: Oral Oral Oral Oral  SpO2: 100% 100%  99%  Weight:      Height:        Intake/Output from previous day:   Intake/Output Summary (Last 24 hours) at 11/24/2020 2147 Last data filed at 11/24/2020 1900 Gross per 24 hour  Intake 980 ml  Output 3700 ml  Net -2720 ml    Physical Exam: General: Alert, awake, oriented x3, in no acute distress.  HEENT: [place]/AT PEERL, EOMI Neck: Trachea midline,  no masses, no thyromegal,y no JVD, no carotid bruit OROPHARYNX:  Moist, No exudate/ erythema/lesions.  Heart: Regular rate and rhythm, without murmurs, rubs, gallops, PMI non-displaced, no heaves or thrills on palpation.  Lungs: Clear to auscultation, no wheezing or rhonchi noted. No increased vocal fremitus resonant to percussion  Abdomen: Soft, nontender, nondistended, positive bowel sounds, no masses no hepatosplenomegaly noted..  Neuro: No focal neurological deficits noted cranial nerves II through XII grossly intact. DTRs 2+ bilaterally upper and lower extremities. Strength 5 out of 5 in bilateral upper and lower extremities. Musculoskeletal: No warm swelling or erythema around joints, no spinal tenderness noted. Psychiatric:  Patient alert and oriented x3, good insight and cognition, good recent to remote recall. Lymph node survey: No cervical axillary or inguinal lymphadenopathy noted.  Lab Results:  Basic Metabolic Panel:    Component Value Date/Time   NA 135 11/23/2020 0542   NA 138 09/05/2020 1211   K 3.7 11/23/2020 0542   CL 103 11/23/2020 0542   CO2 20 (L) 11/23/2020 0542   BUN 11 11/23/2020 0542   BUN 10 09/05/2020 1211   CREATININE 1.03 11/23/2020 0542   GLUCOSE 109 (H) 11/23/2020 0542   CALCIUM 8.4 (L) 11/23/2020 0542   CBC:    Component Value Date/Time   WBC 47.3 (H) 11/24/2020 0603   HGB 11.2 (L) 11/24/2020 0603   HGB 11.9 (L) 09/05/2020 1211   HCT 31.1 (L) 11/24/2020 0603   HCT 36.5 (L) 09/05/2020 1211   PLT 347 11/24/2020 0603   PLT 310 09/05/2020 1211   MCV 73.9 (L) 11/24/2020 0603   MCV 80 09/05/2020 1211   NEUTROABS 37.2 (H) 11/21/2020 0936   NEUTROABS 4.1 09/05/2020 1211   LYMPHSABS 0.8 11/21/2020 0936   LYMPHSABS 2.9 09/05/2020 1211   MONOABS 4.3 (H) 11/21/2020 0936   EOSABS 0.0 11/21/2020 0936   EOSABS 0.2 09/05/2020 1211   BASOSABS 0.1 11/21/2020 0936   BASOSABS 0.1 09/05/2020 1211    Recent Results (from the past 240 hour(s))  Resp Panel by RT-PCR (Flu A&B, Covid) Nasopharyngeal Swab     Status: None   Collection Time: 11/21/20  9:36 AM   Specimen: Nasopharyngeal Swab;  Nasopharyngeal(NP) swabs in vial transport medium  Result Value Ref Range Status   SARS Coronavirus 2 by RT PCR NEGATIVE NEGATIVE Final    Comment: (NOTE) SARS-CoV-2 target nucleic acids are NOT DETECTED.  The SARS-CoV-2 RNA is generally detectable in upper respiratory specimens during the acute phase of infection. The lowest concentration of SARS-CoV-2 viral copies this assay can detect is 138 copies/mL. A negative result does not preclude SARS-Cov-2 infection and should not be used as the sole basis for treatment or other patient management decisions. A negative result may occur with  improper  specimen collection/handling, submission of specimen other than nasopharyngeal swab, presence of viral mutation(s) within the areas targeted by this assay, and inadequate number of viral copies(<138 copies/mL). A negative result must be combined with clinical observations, patient history, and epidemiological information. The expected result is Negative.  Fact Sheet for Patients:  BloggerCourse.com  Fact Sheet for Healthcare Providers:  SeriousBroker.it  This test is no t yet approved or cleared by the [place] FDA and  has been authorized for detection and/or diagnosis of SARS-CoV-2 by FDA under an Emergency Use Authorization (EUA). This EUA will remain  in effect (meaning this test can be used) for the duration of the COVID-19 declaration under Section 564(b)(1) of the Act, 21 U.S.C.section 360bbb-3(b)(1), unless the authorization is terminated  or revoked sooner.       Influenza A by PCR NEGATIVE NEGATIVE Final   Influenza B by PCR NEGATIVE NEGATIVE Final    Comment: (NOTE) The Xpert Xpress SARS-CoV-2/FLU/RSV plus assay is intended as an aid in the diagnosis of influenza from Nasopharyngeal swab specimens and should not be used as a sole basis for treatment. Nasal washings and aspirates are unacceptable for Xpert Xpress SARS-CoV-2/FLU/RSV testing.  Fact Sheet for Patients: BloggerCourse.com  Fact Sheet for Healthcare Providers: SeriousBroker.it  This test is not yet approved or cleared by the [place] FDA and has been authorized for detection and/or diagnosis of SARS-CoV-2 by FDA under an Emergency Use Authorization (EUA). This EUA will remain in effect (meaning this test can be used) for the duration of the COVID-19 declaration under Section 564(b)(1) of the Act, 21 U.S.C. section 360bbb-3(b)(1), unless the authorization is terminated or revoked.  Performed at  [facility], [address], [place], [place] [address]   Urine culture     Status: None   Collection Time: 11/21/20 11:22 PM   Specimen: Urine, Random  Result Value Ref Range Status   Specimen Description   Final    URINE, RANDOM Performed at [facility], [address], [place], [place] [address]    Special Requests   Final    NONE Performed at [facility], [address], [place], [place] [address]    Culture   Final    NO GROWTH Performed at [facility], [address], [place], [place] [address]    Report Status 11/23/2020 FINAL  Final  MRSA PCR Screening     Status: None   Collection Time: 11/22/20  6:25 AM   Specimen: Nasopharyngeal  Result Value Ref Range Status   MRSA by PCR NEGATIVE NEGATIVE Final    Comment:        The GeneXpert MRSA Assay (FDA approved for NASAL specimens only), is one component of a comprehensive MRSA colonization surveillance program. It is not intended to diagnose MRSA infection nor to guide or monitor treatment for MRSA infections. Performed at [facility], [address] [place], [place] [address]   Culture, blood (Routine X 2) w Reflex to ID Panel     Status: None (Preliminary result)   Collection Time: 11/22/20  8:10 AM   Specimen: BLOOD  Result Value Ref Range Status   Specimen Description   Final    BLOOD LEFT ANTECUBITAL Performed at [facility], [address] [place], [place] [address]    Special Requests   Final    BOTTLES DRAWN AEROBIC AND ANAEROBIC Blood Culture adequate volume Performed at [facility], [address], [place], [place] [address]    Culture   Final    NO GROWTH 2 DAYS Performed at [facility], [address], [place], [place] [address]    Report Status PENDING  Incomplete  Culture, blood (Routine X 2) w Reflex to ID Panel     Status: None (Preliminary result)    Collection Time: 11/22/20  8:10 AM   Specimen: BLOOD  Result Value Ref Range Status   Specimen Description   Final    BLOOD RIGHT ANTECUBITAL Performed at [facility], [address], [place], [place] [address]    Special Requests   Final    BOTTLES DRAWN AEROBIC AND ANAEROBIC Blood Culture adequate volume Performed at [facility], [address] [place], [place] [address]    Culture   Final    NO GROWTH 2 DAYS Performed at [facility], [address] [place], [place] [address]    Report Status PENDING  Incomplete    Studies/Results: No results found.  Medications: Scheduled Meds: . enoxaparin (LOVENOX) injection  40 mg Subcutaneous Q24H  . folic acid  1 mg Oral Daily  . senna-docusate  1 tablet Oral BID   Continuous Infusions: . sodium chloride 50 mL/hr at 11/23/20 1341  . azithromycin 500 mg (11/24/20 1027)  . ceFEPime (MAXIPIME) IV 2 g (11/24/20 2114)   PRN Meds:.acetaminophen, guaiFENesin, HYDROmorphone (DILAUDID) injection, oxyCODONE-acetaminophen, polyethylene glycol  Consultants:  None  Procedures:  None  Antibiotics:  IV Azithromycin  IV Cefepime  Assessment/Plan: Principal Problem:   Acute chest syndrome in sickle crisis (HCC) Active Problems:   Sickle cell pain crisis (HCC)   Leukocytosis  Community-acquired pneumonia: Continue IV antibiotics Both strep pneumo urinary antigen and MRSA per PCR negative. Patient does not have an oxygen requirement at this time.  Oxygen saturation is 96% on room air.  Supplemental oxygen as needed Tylenol 650 mg every 4 hours as needed for fever Continue incentive spirometry  Sickle cell disease with pain crisis: Decrease IV fluids to [facility] Dilaudid 1 mg every 6 hours as needed Percocet 5-325 mg every 6 hours as needed for severe breakthrough pain Hold Toradol due to acute kidney injury Monitor vital signs very closely, reevaluate pain scale regularly, and  supplemental oxygen as needed.  Acute kidney injury: Resolved. Creatinine is within normal limits. Continue to hold Toradol. Follow labs in am.   Leukocytosis: Marked leukocytosis., but improved from previous.  Blood cultures continue to be negative.  Will continue IV antibiotics.  Continue to follow very closely.  Sickle cell anemia: Patient's hemoglobin is stable and  consistent with his baseline.  There is no clinical indication for blood transfusion at this time.  Continue to follow closely.    Code Status: Full Code Family Communication: N/A Disposition Plan: Not yet ready for discharge  Zahara Rembert Rennis Petty  APRN, MSN, FNP-C [facility] [address] [place], [place] [address] [phone]  If 7PM-7AM, please contact night-coverage.  11/24/2020, 9:47 PM  LOS: 3 days

## 2020-11-24 NOTE — Progress Notes (Addendum)
   11/24/20 2159  Assess: MEWS Score  Temp (!) 102.3 F (39.1 C)  BP 132/77  Pulse Rate 88  Resp 16  SpO2 96 %  Assess: MEWS Score  MEWS Temp 2  MEWS Systolic 0  MEWS Pulse 0  MEWS RR 0  MEWS LOC 0  MEWS Score 2  MEWS Score Color Yellow   Tylenol given for fever, charge RN made aware, yellow MEWS implemented.

## 2020-11-25 DIAGNOSIS — D72829 Elevated white blood cell count, unspecified: Secondary | ICD-10-CM

## 2020-11-25 DIAGNOSIS — D5700 Hb-SS disease with crisis, unspecified: Secondary | ICD-10-CM

## 2020-11-25 MED ORDER — GUAIFENESIN 100 MG/5ML PO SOLN: 5.0000 mL | ORAL | 0 refills | Status: AC | PRN

## 2020-11-25 MED ORDER — OXYCODONE-ACETAMINOPHEN 5-325 MG PO TABS: 1.0000 | ORAL_TABLET | Freq: Four times a day (QID) | ORAL | 0 refills | Status: AC | PRN

## 2020-11-25 MED ORDER — AZITHROMYCIN 500 MG PO TABS: 500.0000 mg | ORAL_TABLET | Freq: Every day | ORAL | 0 refills | Status: DC

## 2020-11-25 NOTE — Discharge Summary (Signed)
Physician Discharge Summary  Charles Mcgee UJW:[id number] DOB: 10/10/2000 DOA: 11/21/2020  PCP: Charles MaroonHollis, Lachina M, FNP  Admit date: 11/21/2020  Discharge date: 11/25/2020  Discharge Diagnoses:  Principal Problem:   Acute chest syndrome in sickle crisis (HCC) Active Problems:   Sickle cell pain crisis (HCC)   Leukocytosis  Discharge Condition: Stable  Disposition:   Follow-up Information    Charles MaroonHollis, Lachina M, FNP. Go in 3 day(s).   Specialty: Family Medicine [facility] Contact information: [address] [place] [id number] [phone]              Pt is discharged home in good condition and is to follow up with Charles MaroonHollis, Lachina M, FNP this week to have labs evaluated. Charles Mcgee is instructed to increase activity slowly and balance with rest for the next few days, and use prescribed medication to complete treatment of pain  Diet: Regular Wt Readings from Last 3 Encounters:  11/24/20 73.6 kg  09/05/20 79.4 kg  02/22/20 78.9 kg   History of present illness:  Charles Mcgee  is a 20 y.o. male with a medical history significant for sickle cell disease presents to the ER with complaints of right side pain over the past 2 days.  Patient says that right side pain is not consistent with his typical sickle cell pain crisis.  Patient says that he started feeling generally unwell and underwent a COVID test which was negative. However he reports a max patient says that he had a fever at home and maximum temperature was 102.  He states that he awakened with increased pain which was unrelieved by home ibuprofen and Percocet. Patient has infrequent pain crisis and does not consistently take medications for his sickle cell disease.  He is mostly opiate nave.  Current pain intensity is 6/10 characterized as intermittent and aching.  He says that pain mostly occurs on inspiration.  He denies dizziness, paresthesias, no current shortness of breath urinary  symptoms, nausea, vomiting, or diarrhea.  He has had no sick contacts, recent travel, or exposure to COVID-19.  Of note, he has been fully vaccinated against COVID.  ER course: Patient presented with right side pain.  He underwent chest x-ray which shows right basilar opacity.  Vital signs mostly normal.  Temperature 98.1 F, BP 99/68, pulse rate 70, oxygen saturation 99% on RA.  Respiratory panel negative for COVID-19 and influenza.  WBC count markedly elevated at 43.2.  Hemoglobin 12.1 and platelets 397.  Total bilirubin 2.8, complete metabolic panel otherwise unremarkable.  Patient admitted for acute chest syndrome in the setting of sickle cell crisis.  Hospital Course:  Patient was admitted for sickle cell pain crisis with acute chest syndrome and managed appropriately with IVF, IV antibiotics, IV Dilaudid via PCA and IV Toradol, as well as other adjunct therapies per sickle cell pain management protocols. Patient received IV ceftriaxone and IV azithromycin for 4 days and was transitioned to p.o. azithromycin.  Patient has allergy to penicillin and amoxicillin continue medications. These were avoided throughout this admission. Patient's hemoglobin remained stable throughout this admission. White cell count was significantly elevated but is at the time of discharge was showing downward trend. Patient remained afebrile. Pain slowly returned to baseline. Patient did not require blood transfusion during this admission. As of today, patient is doing very well ambulating well with no significant pain and tolerating p.o. intake with no restrictions. Patient would like to be discharged home to resume his classes and  school final exam.  Patient was found stable to be discharged home. Patient was therefore discharged home today in a hemodynamically stable condition.   Charles Mcgee will follow-up with PCP within 1 week of this discharge. Charles Mcgee was counseled extensively about nonpharmacologic means of pain management,  patient verbalized understanding and was appreciative of  the care received during this admission.   We discussed the need for good hydration, monitoring of hydration status, avoidance of heat, cold, stress, and infection triggers. We discussed the need to be adherent with taking Hydrea and other home medications. Patient was reminded of the need to seek medical attention immediately if any symptom of bleeding, anemia, or infection occurs.  Discharge Exam: Vitals:   11/25/20 0554 11/25/20 1018  BP: (!) 105/59 127/85  Pulse: 75 94  Resp: 20 (!) 22  Temp: 99.3 F (37.4 C) 99.2 F (37.3 C)  SpO2: 96% 97%   Vitals:   11/25/20 0002 11/25/20 0204 11/25/20 0554 11/25/20 1018  BP: 119/60 123/79 (!) 105/59 127/85  Pulse: 83 89 75 94  Resp: 16 16 20  (!) 22  Temp: 99.6 F (37.6 C) 98.3 F (36.8 C) 99.3 F (37.4 C) 99.2 F (37.3 C)  TempSrc: Oral Oral Oral Oral  SpO2: 96% 98% 96% 97%  Weight:      Height:        General appearance : Awake, alert, not in any distress. Speech Clear. Not toxic looking HEENT: Atraumatic and Normocephalic, pupils equally reactive to light and accomodation Neck: Supple, no JVD. No cervical lymphadenopathy.  Chest: Good air entry bilaterally, no added sounds  CVS: S1 S2 regular, no murmurs.  Abdomen: Bowel sounds present, Non tender and not distended with no gaurding, rigidity or rebound. Extremities: B/L Lower Ext shows no edema, both legs are warm to touch Neurology: Awake alert, and oriented X 3, CN II-XII intact, Non focal Skin: No Rash  Discharge Instructions  Discharge Instructions    Diet - low sodium heart healthy   Complete by: As directed    Diet general   Complete by: As directed    Increase activity slowly   Complete by: As directed      Allergies as of 11/25/2020      Reactions   Amoxicillin Itching      Medication List    TAKE these medications   azithromycin 500 MG tablet Commonly known as: Zithromax Take 1 tablet (500 mg  total) by mouth daily for 3 days. Take 1 tablet daily for 3 days.   cetirizine 10 MG tablet Commonly known as: ZYRTEC Take 10 mg by mouth daily.   ergocalciferol 1.25 MG (50000 UT) capsule Commonly known as: VITAMIN D2 Take 1 capsule (50,000 Units total) by mouth once a week.   folic acid 1 MG tablet Commonly known as: FOLVITE Take 1 tablet (1 mg total) by mouth daily.   guaiFENesin 100 MG/5ML Soln Commonly known as: ROBITUSSIN Take 5 mLs (100 mg total) by mouth every 4 (four) hours as needed for cough or to loosen phlegm.   ibuprofen 800 MG tablet Commonly known as: ADVIL Take 1 tablet (800 mg total) by mouth every 8 (eight) hours as needed. What changed: Another medication with the same name was removed. Continue taking this medication, and follow the directions you see here.   oxyCODONE-acetaminophen 5-325 MG tablet Commonly known as: PERCOCET/ROXICET Take 1 tablet by mouth every 6 (six) hours as needed for severe pain.       The results of significant diagnostics  from this hospitalization (including imaging, microbiology, ancillary and laboratory) are listed below for reference.    Significant Diagnostic Studies: CT ANGIO CHEST PE W OR WO CONTRAST  Result Date: 11/22/2020 CLINICAL DATA:  Chest pain.  History of sickle cell disease EXAM: CT ANGIOGRAPHY CHEST WITH CONTRAST TECHNIQUE: Multidetector CT imaging of the chest was performed using the standard protocol during bolus administration of intravenous contrast. Multiplanar CT image reconstructions and MIPs were obtained to evaluate the vascular anatomy. CONTRAST:  75 mL OMNIPAQUE IOHEXOL 350 MG/ML SOLN COMPARISON:  Chest radiograph November 21, 2020 FINDINGS: Cardiovascular: There is no demonstrable pulmonary embolus. There is no thoracic aortic aneurysm or dissection. Visualized great vessels appear unremarkable. There is no pericardial effusion or pericardial thickening. Mediastinum/Nodes: Thyroid appears normal. There is a  lymph node in the subcarinal region measuring 1.5 x 1.2 cm. No other adenopathy evident in the thoracic region. No esophageal lesions are evident. Lungs/Pleura: There is a sizable right pleural effusion with a much smaller left pleural effusion. Fluid tracks along the right major and minor fissures. There is extensive airspace opacity throughout much of the right middle and lower lobes. There is also airspace opacity in the posterior segment of the left upper lobe. There is atelectatic change in the left base. The trachea and major bronchial structures appear patent. No pneumothorax. Upper Abdomen: There is incomplete visualization of an apparent enlarged lymph node immediately to the right of the inferior vena cava at the upper kidney level measuring 2.1 x 1.8 cm. Visualized upper abdominal structures otherwise appear unremarkable. Musculoskeletal: Bones are diffusely sclerotic consistent with known sickle cell disease. There are several endplate infarcts in the thoracic region. 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 No chest wall lesions. Review of the MIP images confirms the above findings. IMPRESSION: 1. No evident pulmonary embolus. No thoracic aortic aneurysm or dissection. 2. Sizable right pleural effusion with small left pleural effusion. Airspace opacity consistent with pneumonia throughout much of the right middle and lower lobes. Pneumonia with localized consolidation also noted in the posterior segment left upper lobe. There is left base atelectasis. 3. Incomplete visualization of suspected enlarged retroperitoneal adjacent to the inferior vena cava. Enlarged subcarinal lymph node evident. 4.  Bony changes indicative of sickle cell disease. Electronically Signed   By: Bretta Bang III Mcgee.D.   On: 11/22/2020 12:16   DG Chest Port 1 View  Result Date: 11/21/2020 CLINICAL DATA:  Chest pain, shortness of breath EXAM: PORTABLE CHEST 1 VIEW COMPARISON:  June 2021  FINDINGS: Opacity at the right lung base. No definite pleural effusion. No pneumothorax. Normal heart size. IMPRESSION: Right basilar opacity may reflect pneumonia in the appropriate clinical setting. Electronically Signed   By: Guadlupe Spanish Mcgee.D.   On: 11/21/2020 09:45    Microbiology: Recent Results (from the past 240 hour(s))  Resp Panel by RT-PCR (Flu A&B, Covid) Nasopharyngeal Swab     Status: None   Collection Time: 11/21/20  9:36 AM   Specimen: Nasopharyngeal Swab; Nasopharyngeal(NP) swabs in vial transport medium  Result Value Ref Range Status   SARS Coronavirus 2 by RT PCR NEGATIVE NEGATIVE Final    Comment: (NOTE) SARS-CoV-2 target nucleic acids are NOT DETECTED.  The SARS-CoV-2 RNA is generally detectable in upper respiratory specimens during the acute phase of infection. The lowest concentration of SARS-CoV-2 viral copies this assay can detect is 138 copies/mL. A negative result does not preclude SARS-Cov-2 infection and should not be used as the sole  basis for treatment or other patient management decisions. A negative result may occur with  improper specimen collection/handling, submission of specimen other than nasopharyngeal swab, presence of viral mutation(s) within the areas targeted by this assay, and inadequate number of viral copies(<138 copies/mL). A negative result must be combined with clinical observations, patient history, and epidemiological information. The expected result is Negative.  Fact Sheet for Patients:  BloggerCourse.com  Fact Sheet for Healthcare Providers:  SeriousBroker.it  This test is no t yet approved or cleared by the [place] FDA and  has been authorized for detection and/or diagnosis of SARS-CoV-2 by FDA under an Emergency Use Authorization (EUA). This EUA will remain  in effect (meaning this test can be used) for the duration of the COVID-19 declaration under Section 564(b)(1) of  the Act, 21 U.S.C.section 360bbb-3(b)(1), unless the authorization is terminated  or revoked sooner.       Influenza A by PCR NEGATIVE NEGATIVE Final   Influenza B by PCR NEGATIVE NEGATIVE Final    Comment: (NOTE) The Xpert Xpress SARS-CoV-2/FLU/RSV plus assay is intended as an aid in the diagnosis of influenza from Nasopharyngeal swab specimens and should not be used as a sole basis for treatment. Nasal washings and aspirates are unacceptable for Xpert Xpress SARS-CoV-2/FLU/RSV testing.  Fact Sheet for Patients: BloggerCourse.com  Fact Sheet for Healthcare Providers: SeriousBroker.it  This test is not yet approved or cleared by the [place] FDA and has been authorized for detection and/or diagnosis of SARS-CoV-2 by FDA under an Emergency Use Authorization (EUA). This EUA will remain in effect (meaning this test can be used) for the duration of the COVID-19 declaration under Section 564(b)(1) of the Act, 21 U.S.C. section 360bbb-3(b)(1), unless the authorization is terminated or revoked.  Performed at [facility], [address] [place], [place] [address]   Urine culture     Status: None   Collection Time: 11/21/20 11:22 PM   Specimen: Urine, Random  Result Value Ref Range Status   Specimen Description   Final    URINE, RANDOM Performed at [facility], [address] [place], [place] [address]    Special Requests   Final    NONE Performed at [facility], [address], [place], [place] [address]    Culture   Final    NO GROWTH Performed at [facility], [address] [place], [place] [address]    Report Status 11/23/2020 FINAL  Final  MRSA PCR Screening     Status: None   Collection Time: 11/22/20  6:25 AM   Specimen: Nasopharyngeal  Result Value Ref Range Status   MRSA by PCR NEGATIVE NEGATIVE Final    Comment:        The GeneXpert MRSA  Assay (FDA approved for NASAL specimens only), is one component of a comprehensive MRSA colonization surveillance program. It is not intended to diagnose MRSA infection nor to guide or monitor treatment for MRSA infections. Performed at [facility], [address] [place], [place] [address]   Culture, blood (Routine X 2) w Reflex to ID Panel     Status: None (Preliminary result)   Collection Time: 11/22/20  8:10 AM   Specimen: BLOOD  Result Value Ref Range Status   Specimen Description   Final    BLOOD LEFT ANTECUBITAL Performed at [facility], [address] [place], [place] [address]    Special Requests   Final    BOTTLES  DRAWN AEROBIC AND ANAEROBIC Blood Culture adequate volume Performed at [facility], [address] [place], [place] [address]    Culture   Final    NO GROWTH 3 DAYS Performed at [facility], [address], [place], [place] [address]    Report Status PENDING  Incomplete  Culture, blood (Routine X 2) w Reflex to ID Panel     Status: None (Preliminary result)   Collection Time: 11/22/20  8:10 AM   Specimen: BLOOD  Result Value Ref Range Status   Specimen Description   Final    BLOOD RIGHT ANTECUBITAL Performed at [facility], [address] [place], [place] [address]    Special Requests   Final    BOTTLES DRAWN AEROBIC AND ANAEROBIC Blood Culture adequate volume Performed at [facility], [address] [place], [place] [address]    Culture   Final    NO GROWTH 3 DAYS Performed at [facility], [address], [place], [place] [address]    Report Status PENDING  Incomplete     Labs: Basic Metabolic Panel: Recent Labs  Lab 11/21/20 0936 11/22/20 0508 11/23/20 0542  NA 136 133* 135  K 3.5 4.9 3.7  CL 104 100 103  CO2 21* 23 20*  GLUCOSE 131* 105* 109*  BUN 12 14 11   CREATININE 1.19 1.34* 1.03  CALCIUM 9.1 8.5* 8.4*   Liver  Function Tests: Recent Labs  Lab 11/21/20 0936 11/22/20 0508  AST 17 19  ALT 11 12  ALKPHOS 47 60  BILITOT 2.8* 3.0*  PROT 8.1 7.4  ALBUMIN 4.1 3.7   Recent Labs  Lab 11/21/20 0936  LIPASE 23   No results for input(s): AMMONIA in the last 168 hours. CBC: Recent Labs  Lab 11/21/20 0936 11/22/20 0508 11/23/20 0542 11/24/20 0603  WBC 43.2* 59.3* 54.5* 47.3*  NEUTROABS 37.2*  --   --   --   HGB 12.1* 12.8* 10.7* 11.2*  HCT 34.0* 36.4* 29.1* 31.1*  MCV 75.2* 76.2* 74.0* 73.9*  PLT 397 390 318 347   Cardiac Enzymes: No results for input(s): CKTOTAL, CKMB, CKMBINDEX, TROPONINI in the last 168 hours. BNP: Invalid input(s): POCBNP CBG: No results for input(s): GLUCAP in the last 168 hours.  Time coordinating discharge: 50 minutes  Signed:  Phenix Grein  [facility] 11/25/2020, 12:05 PM

## 2020-11-25 NOTE — Discharge Instructions (Signed)
Sickle Cell Anemia, Adult  Sickle cell anemia is a condition in which red blood cells have an abnormal "sickle" shape. Red blood cells carry oxygen through the body. Sickle-shaped red blood cells do not live as long as normal red blood cells. They also clump together and block blood from flowing through the blood vessels. This condition prevents the body from getting enough oxygen. Sickle cell anemia causes organ damage and pain. It also increases the risk of infection. What are the causes? This condition is caused by a gene that is passed from parent to child (inherited). Receiving two copies of the gene causes the disease. Receiving one copy causes the "trait," which means that symptoms are milder or not present. What increases the risk? This condition is more likely to develop if your ancestors were from [place], the Mediterranean, [place] or [place], the [place], [place], or the [place]. What are the signs or symptoms? Symptoms of this condition include:  Episodes of pain (crises), especially in the hands and feet, joints, back, chest, or abdomen. The pain can be triggered by: ? An illness, especially if there is dehydration. ? Doing an activity with great effort (overexertion). ? Exposure to extreme temperature changes. ? High altitude.  Fatigue.  Shortness of breath or difficulty breathing.  Dizziness.  Pale skin or yellowed skin (jaundice).  Frequent bacterial infections.  Pain and swelling in the hands and feet (hand-food syndrome).  Prolonged, painful erection of the penis (priapism).  Acute chest syndrome. Symptoms of this include: ? Chest pain. ? Fever. ? Cough. ? Fast breathing.  Stroke.  Decreased activity.  Loss of appetite.  Change in behavior.  Headaches.  Seizures.  Vision changes.  Skin ulcers.  Heart disease.  High blood pressure.  Gallstones.  Liver and kidney problems. How is this diagnosed? This condition is diagnosed with  blood tests that check for the gene that causes this condition. How is this treated? There is no cure for most cases of this condition. Treatment focuses on managing your symptoms and preventing complications of the disease. Your health care provider will work with you to identify the best treatment options for you based on an assessment of your condition. Treatment may include:  Medicines, including: ? Pain medicines. ? Antibiotic medicines for infection. ? Medicines to increase the production of a protein in red blood cells that helps carry oxygen in the body (hemoglobin).  Fluids to treat pain and swelling.  Oxygen to treat acute chest syndrome.  Blood transfusions to treat symptoms such as fatigue, stroke, and acute chest syndrome.  Massage and physical therapy for pain.  Regular tests to monitor your condition, such as blood tests, X-rays, CT scans, MRI scans, ultrasounds, and lung function tests. These should be done every 3-12 months, depending on your age.  Hematopoietic stem cell transplant. This is a procedure to replace abnormal stem cells with healthy stem cells from a donor's bone marrow. Stem cells are cells that can develop into blood cells, and bone marrow is the spongy tissue inside the bones. Follow these instructions at home: Medicines  Take over-the-counter and prescription medicines only as told by your health care provider.  If you were prescribed an antibiotic medicine, take it as told by your health care provider. Do not stop taking the antibiotic even if you start to feel better.  If you develop a fever, do not take medicines to reduce the fever right away. This could cover up another problem. Notify your health care provider. Managing   pain, stiffness, and swelling  Try these methods to help ease your pain: ? Using a heating pad. ? Taking a warm bath. ? Distracting yourself, such as by watching TV. Eating and drinking  Drink enough fluid to keep your urine  clear or pale yellow. Drink more in hot weather and during exercise.  Limit or avoid drinking alcohol.  Eat a balanced and nutritious diet. Eat plenty of fruits, vegetables, whole grains, and lean protein.  Take vitamins and supplements as directed by your health care provider. Traveling  When traveling, keep these with you: ? Your medical information. ? The names of your health care providers. ? Your medicines.  If you have to travel by air, ask about precautions you should take. Activity  Get plenty of rest.  Avoid activities that will lower your oxygen levels, such as exercising vigorously. General instructions  Do not use any products that contain nicotine or tobacco, such as cigarettes and e-cigarettes. They lower blood oxygen levels. If you need help quitting, ask your health care provider.  Consider wearing a medical alert bracelet.  Avoid high altitudes.  Avoid extreme temperatures and extreme temperature changes.  Keep all follow-up visits as told by your health care provider. This is important. Contact a health care provider if:  You develop joint pain.  Your feet or hands swell or have pain.  You have fatigue. Get help right away if:  You have symptoms of infection. These include: ? Fever. ? Chills. ? Extreme tiredness. ? Irritability. ? Poor eating. ? Vomiting.  You feel dizzy or faint.  You have new abdominal pain, especially on the left side near the stomach area.  You develop priapism.  You have numbness in your arms or legs or have trouble moving them.  You have trouble talking.  You develop pain that cannot be controlled with medicine.  You become short of breath.  You have rapid breathing.  You have a persistent cough.  You have pain in your chest.  You develop a severe headache or stiff neck.  You feel bloated without eating or after eating a small amount of food.  Your skin is pale.  You suddenly lose  vision. Summary  Sickle cell anemia is a condition in which red blood cells have an abnormal "sickle" shape. This disease can cause organ damage and chronic pain, and it can raise your risk of infection.  Sickle cell anemia is a genetic disorder.  Treatment focuses on managing your symptoms and preventing complications of the disease.  Get medical help right away if you have any signs of infection, such as a fever. This information is not intended to replace advice given to you by your health care provider. Make sure you discuss any questions you have with your health care provider. Document Revised: 12/09/2019 Document Reviewed: 12/09/2019 Elsevier Patient Education  2021 [employer]  

## 2020-11-25 NOTE — Progress Notes (Signed)
Patient discharged to home with family, discharge instructions reviewed with patient who verbalized understanding. 

## 2020-11-27 LAB — CULTURE, BLOOD (ROUTINE X 2)
Culture: NO GROWTH
Culture: NO GROWTH
Special Requests: ADEQUATE
Special Requests: ADEQUATE

## 2020-11-28 ENCOUNTER — Ambulatory Visit: Payer: Managed Care, Other (non HMO) | Admitting: Family Medicine

## 2020-11-28 ENCOUNTER — Encounter: Payer: Self-pay | Admitting: Family Medicine

## 2020-11-28 ENCOUNTER — Other Ambulatory Visit: Payer: Self-pay

## 2020-11-28 VITALS — BP 127/72 | HR 93 | Temp 100.4°F | Ht 72.0 in | Wt 149.0 lb

## 2020-11-28 DIAGNOSIS — J189 Pneumonia, unspecified organism: Secondary | ICD-10-CM | POA: Diagnosis not present

## 2020-11-28 DIAGNOSIS — Z09 Encounter for follow-up examination after completed treatment for conditions other than malignant neoplasm: Secondary | ICD-10-CM

## 2020-11-28 DIAGNOSIS — D5720 Sickle-cell/Hb-C disease without crisis: Secondary | ICD-10-CM

## 2020-11-28 DIAGNOSIS — E559 Vitamin D deficiency, unspecified: Secondary | ICD-10-CM | POA: Diagnosis not present

## 2020-11-28 DIAGNOSIS — D5700 Hb-SS disease with crisis, unspecified: Secondary | ICD-10-CM

## 2020-11-28 MED ORDER — IBUPROFEN 600 MG PO TABS: 600.0000 mg | ORAL_TABLET | Freq: Three times a day (TID) | ORAL | 0 refills | Status: AC | PRN

## 2020-11-28 NOTE — Patient Instructions (Signed)
Community-Acquired Pneumonia, Adult Pneumonia is an infection of the lungs. It causes irritation and swelling in the airways of the lungs. Mucus and fluid may also build up inside the airways. This may cause coughing and trouble breathing. One type of pneumonia can happen while you are in a hospital. A different type can happen when you are not in a hospital (community-acquired pneumonia). What are the causes? This condition is caused by germs (viruses, bacteria, or fungi). Some types of germs can spread from person to person. Pneumonia is not thought to spread from person to person.   What increases the risk? You are more likely to develop this condition if:  You have a long-term (chronic) disease, such as: ? Disease of the lungs. This may be chronic obstructive pulmonary disease (COPD) or asthma. ? Heart failure. ? Cystic fibrosis. ? Diabetes. ? Kidney disease. ? Sickle cell disease. ? HIV.  You have other health problems, such as: ? Your body's defense system (immune system) is weak. ? A condition that may cause you to breathe in fluids from your mouth and nose.  You had your spleen taken out.  You do not take good care of your teeth and mouth (poor dental hygiene).  You use or have used tobacco products.  You travel where the germs that cause this illness are common.  You are near certain animals or the places they live.  You are older than 22 years of age. What are the signs or symptoms? Symptoms of this condition include:  A cough.  A fever.  Sweating or chills.  Chest pain, often when you breathe deeply or cough.  Breathing problems, such as: ? Fast breathing. ? Trouble breathing. ? Shortness of breath.  Feeling tired (fatigued).  Muscle aches. How is this treated? Treatment for this condition depends on many things, such as:  The cause of your illness.  Your medicines.  Your other health problems. Most adults can be treated at home. Sometimes,  treatment must happen in a hospital.  Treatment may include medicines to kill germs.  Medicines may depend on which germ caused your illness. Very bad pneumonia is rare. If you get it, you may:  Have a machine to help you breathe.  Have fluid taken away from around your lungs. Follow these instructions at home: Medicines  Take over-the-counter and prescription medicines only as told by your doctor.  Take cough medicine only if you are losing sleep. Cough medicine can keep your body from taking mucus away from your lungs.  If you were prescribed an antibiotic medicine, take it as told by your doctor. Do not stop taking the antibiotic even if you start to feel better. Lifestyle  Do not drink alcohol.  Do not use any products that contain nicotine or tobacco, such as cigarettes, e-cigarettes, and chewing tobacco. If you need help quitting, ask your doctor.  Eat a healthy diet. This includes a lot of vegetables, fruits, whole grains, low-fat dairy products, and low-fat (lean) protein.      General instructions  Rest a lot. Sleep for at least 8 hours each night.  Sleep with your head and neck raised. Put a few pillows under your head or sleep in a reclining chair.  Return to your normal activities as told by your doctor. Ask your doctor what activities are safe for you.  Drink enough fluid to keep your pee (urine) pale yellow.  If your throat is sore, rinse your mouth often with salt water. To make salt   water, dissolve -1 tsp (3-6 g) of salt in 1 cup (237 mL) of warm water.  Keep all follow-up visits as told by your doctor. This is important.   How is this prevented? You can lower your risk of pneumonia by:  Getting the pneumonia shot (vaccine). These shots have different types and schedules. Ask your doctor what works best for you. Think about getting this shot if: ? You are older than 22 years of age. ? You are 19-65 years of age and:  You are being treated for  cancer.  You have long-term lung disease.  You have other problems that affect your body's defense system. Ask your doctor if you have one of these.  Getting your flu shot every year. Ask your doctor which type of shot is best for you.  Going to the dentist as often as told.  Washing your hands often with soap and water for at least 20 seconds. If you cannot use soap and water, use hand sanitizer. Contact a doctor if:  You have a fever.  You lose sleep because your cough medicine does not help. Get help right away if:  You are short of breath and this gets worse.  You have more chest pain.  Your sickness gets worse. This is very serious if: ? You are an older adult. ? Your body's defense system is weak.  You cough up blood. These symptoms may be an emergency. Do not wait to see if the symptoms will go away. Get medical help right away. Call your local emergency services (911 in the [place] Do not drive yourself to the hospital. Summary  Pneumonia is an infection of the lungs.  Community-acquired pneumonia affects people who have not been in the hospital. Certain germs can cause this infection.  This condition may be treated with medicines that kill germs.  For very bad pneumonia, you may need a hospital stay and treatment to help with breathing. This information is not intended to replace advice given to you by your health care provider. Make sure you discuss any questions you have with your health care provider. Document Revised: 04/27/2019 Document Reviewed: 04/27/2019 Elsevier Patient Education  2021 Elsevier Inc.  

## 2020-11-28 NOTE — Progress Notes (Signed)
[facility]   Established Patient Office Visit  Subjective:  Patient ID: Charles Ogren., male    DOB: 2001-01-15  Age: 20 y.o. MRN: [id number]  CC:  Chief Complaint  Patient presents with  . Hospitalization Follow-up    Follow up from hospital follow up , pain level 4  having back  around mid back     HPI Charles Mcgee. is a very pleasant 20 year old male with a medical history significant for sickle cell disease that presents for post hospital follow-up.  Patient was recently hospitalized with community-acquired pneumonia in the setting of sickle cell pain crisis.  Patient was admitted to inpatient services from 4/26-4/30/2022.  At that time, he was treated with IV antibiotics and pain management.  Pain intensity improved throughout admission.  Pain was primarily to right chest.  He underwent CT angiogram which did not show any evidence of pulmonary embolism, however there was a sizable right pleural effusion with small left pleural effusion, airspace opacity that was consistent with pneumonia throughout much of the right middle and lower lobes.  Pneumonia was visualized with localized consolidation and was also noted in the posterior segment of the left upper lobe and there was left base atelectasis as well.  Patient was discharged home with azithromycin 500 mg for 3 days to complete treatment.  He continues to experience discomfort to right chest.  Discomfort primarily occurs with deep inspiration.  He denies any fever, chills, shortness of breath, dizziness, headache, nausea, vomiting, or diarrhea.  He currently rates pain as 2/10.  He has been hydrating consistently and taking folic acid daily.  Past Medical History:  Diagnosis Date  . Sickle cell anemia (HCC)     History reviewed. No pertinent surgical history.  History reviewed. No pertinent family history.  Social History   Socioeconomic History  . Marital  status: Single    Spouse name: Not on file  . Number of children: Not on file  . Years of education: Not on file  . Highest education level: Not on file  Occupational History  . Not on file  Tobacco Use  . Smoking status: Never Smoker  . Smokeless tobacco: Never Used  Vaping Use  . Vaping Use: Never used  Substance and Sexual Activity  . Alcohol use: Yes    Comment: occ  . Drug use: Yes    Types: Marijuana  . Sexual activity: Yes    Partners: Female  Other Topics Concern  . Not on file  Social History Narrative  . Not on file   Social Determinants of Health   Financial Resource Strain: Not on file  Food Insecurity: Not on file  Transportation Needs: Not on file  Physical Activity: Not on file  Stress: Not on file  Social Connections: Not on file  Intimate Partner Violence: Not on file    Outpatient Medications Prior to Visit  Medication Sig Dispense Refill  . ergocalciferol (VITAMIN D2) 1.25 MG (50000 UT) capsule Take 1 capsule (50,000 Units total) by mouth once a week. 12 capsule 1  . folic acid (FOLVITE) 1 MG tablet Take 1 tablet (1 mg total) by mouth daily. 30 tablet 11  . guaiFENesin (ROBITUSSIN) 100 MG/5ML SOLN Take 5 mLs (100 mg total) by mouth every 4 (four) hours as needed for cough or to loosen phlegm. 236 mL 0  . oxyCODONE-acetaminophen (PERCOCET/ROXICET) 5-325 MG tablet Take 1 tablet by mouth every 6 (six) hours as needed  for severe pain. 30 tablet 0  . cetirizine (ZYRTEC) 10 MG tablet Take 10 mg by mouth daily. (Patient not taking: Reported on 11/28/2020)    . ibuprofen (ADVIL) 800 MG tablet Take 1 tablet (800 mg total) by mouth every 8 (eight) hours as needed. (Patient not taking: No sig reported) 30 tablet 1  . azithromycin (ZITHROMAX) 500 MG tablet Take 1 tablet (500 mg total) by mouth daily for 3 days. Take 1 tablet daily for 3 days. 3 tablet 0   No facility-administered medications prior to visit.    Allergies  Allergen Reactions  . Amoxicillin Itching     ROS Review of Systems    Objective:    Physical Exam  BP 127/72 (BP Location: Left Arm, Patient Position: Sitting, Cuff Size: Normal)   Pulse 93   Temp (!) 100.4 F (38 C) (Temporal)   Ht 6' (1.829 m)   Wt 149 lb (67.6 kg)   SpO2 99%   BMI 20.21 kg/m  Wt Readings from Last 3 Encounters:  11/28/20 149 lb (67.6 kg)  11/24/20 162 lb 4.8 oz (73.6 kg)  09/05/20 175 lb (79.4 kg)     Health Maintenance Due  Topic Date Due  . HPV VACCINES (1 - Male 2-dose series) Never done  . TETANUS/TDAP  Never done  . COVID-19 Vaccine (3 - Booster for Moderna series) 05/24/2020       Topic Date Due  . HPV VACCINES (1 - Male 2-dose series) Never done    No results found for: TSH Lab Results  Component Value Date   WBC 47.3 (H) 11/24/2020   HGB 11.2 (L) 11/24/2020   HCT 31.1 (L) 11/24/2020   MCV 73.9 (L) 11/24/2020   PLT 347 11/24/2020   Lab Results  Component Value Date   NA 135 11/23/2020   K 3.7 11/23/2020   CO2 20 (L) 11/23/2020   GLUCOSE 109 (H) 11/23/2020   BUN 11 11/23/2020   CREATININE 1.03 11/23/2020   BILITOT 3.0 (H) 11/22/2020   ALKPHOS 60 11/22/2020   AST 19 11/22/2020   ALT 12 11/22/2020   PROT 7.4 11/22/2020   ALBUMIN 3.7 11/22/2020   CALCIUM 8.4 (L) 11/23/2020   ANIONGAP 12 11/23/2020   No results found for: CHOL No results found for: HDL No results found for: LDLCALC No results found for: TRIG No results found for: CHOLHDL No results found for: TZGY1V    Assessment & Plan:   Problem List Items Addressed This Visit      Other   Vitamin D deficiency - Primary   Relevant Orders   Sickle Cell Panel (Completed)    Other Visit Diagnoses    Hospital discharge follow-up       Sickle cell-hemoglobin C disease without crisis (HCC)       Relevant Medications   ibuprofen (ADVIL) 600 MG tablet   Other Relevant Orders   Sickle Cell Panel (Completed)   Community acquired pneumonia of right middle lobe of lung       Relevant Orders   DG Chest 2  View     1. Hospital discharge follow-up Patient was hospitalized recently for community-acquired pneumonia in the setting of sickle cell pain crisis.  Improving.  Bilateral lungs clear.  Oxygen saturation within normal limits.  Greater than 90%.  Patient has completed antibiotic therapy, does not require any further interventions.  Will repeat chest x-ray in 4 weeks. During hospitalization, leukocytosis noted, will repeat CBC today.  2. Sickle cell-hemoglobin C disease  without crisis (HCC) Continue folic acid daily.  Patient is not on any disease modifying agents.  He has very infrequent pain crises. - ibuprofen (ADVIL) 600 MG tablet; Take 1 tablet (600 mg total) by mouth every 8 (eight) hours as needed.  Dispense: 30 tablet; Refill: 0 - Sickle Cell Panel  3. Vitamin D deficiency - Sickle Cell Panel  4. Community acquired pneumonia of right middle lobe of lung - DG Chest 2 View; Future   Follow-up: 3 months prior to relocating to [facility]  APRN, MSN, FNP-C [facility] [address] [place], [place] [address] [phone]

## 2020-11-29 ENCOUNTER — Other Ambulatory Visit: Payer: Self-pay | Admitting: Family Medicine

## 2020-11-29 ENCOUNTER — Telehealth: Payer: Self-pay

## 2020-11-29 ENCOUNTER — Telehealth: Payer: Self-pay | Admitting: Family Medicine

## 2020-11-29 DIAGNOSIS — E559 Vitamin D deficiency, unspecified: Secondary | ICD-10-CM

## 2020-11-29 LAB — CMP14+CBC/D/PLT+FER+RETIC+V...
ALT: 21 IU/L (ref 0–44)
AST: 21 IU/L (ref 0–40)
Albumin/Globulin Ratio: 0.7 — ABNORMAL LOW (ref 1.2–2.2)
Albumin: 3.5 g/dL — ABNORMAL LOW (ref 4.1–5.2)
Alkaline Phosphatase: 58 IU/L (ref 44–121)
BUN/Creatinine Ratio: 6 — ABNORMAL LOW (ref 9–20)
BUN: 6 mg/dL (ref 6–20)
Basophils Absolute: 0.1 10*3/uL (ref 0.0–0.2)
Basos: 1 %
Bilirubin Total: 0.8 mg/dL (ref 0.0–1.2)
CO2: 22 mmol/L (ref 20–29)
Calcium: 8.5 mg/dL — ABNORMAL LOW (ref 8.7–10.2)
Chloride: 96 mmol/L (ref 96–106)
Creatinine, Ser: 1 mg/dL (ref 0.76–1.27)
EOS (ABSOLUTE): 0.2 10*3/uL (ref 0.0–0.4)
Eos: 1 %
Ferritin: 1291 ng/mL — ABNORMAL HIGH (ref 30–400)
Globulin, Total: 5 g/dL — ABNORMAL HIGH (ref 1.5–4.5)
Glucose: 66 mg/dL (ref 65–99)
Hematocrit: 31.5 % — ABNORMAL LOW (ref 37.5–51.0)
Hemoglobin: 10.4 g/dL — ABNORMAL LOW (ref 13.0–17.7)
Immature Grans (Abs): 0.5 10*3/uL — ABNORMAL HIGH (ref 0.0–0.1)
Immature Granulocytes: 3 %
Lymphocytes Absolute: 1.9 10*3/uL (ref 0.7–3.1)
Lymphs: 11 %
MCH: 25.8 pg — ABNORMAL LOW (ref 26.6–33.0)
MCHC: 33 g/dL (ref 31.5–35.7)
MCV: 78 fL — ABNORMAL LOW (ref 79–97)
Monocytes Absolute: 1.4 10*3/uL — ABNORMAL HIGH (ref 0.1–0.9)
Monocytes: 8 %
Neutrophils Absolute: 13.5 10*3/uL — ABNORMAL HIGH (ref 1.4–7.0)
Neutrophils: 76 %
Platelets: 658 10*3/uL — ABNORMAL HIGH (ref 150–450)
Potassium: 4.6 mmol/L (ref 3.5–5.2)
RBC: 4.03 x10E6/uL — ABNORMAL LOW (ref 4.14–5.80)
RDW: 15.7 % — ABNORMAL HIGH (ref 11.6–15.4)
Retic Ct Pct: 2.2 % (ref 0.6–2.6)
Sodium: 134 mmol/L (ref 134–144)
Total Protein: 8.5 g/dL (ref 6.0–8.5)
Vit D, 25-Hydroxy: 12.4 ng/mL — ABNORMAL LOW (ref 30.0–100.0)
WBC: 17.6 10*3/uL — ABNORMAL HIGH (ref 3.4–10.8)
eGFR: 109 mL/min/{1.73_m2} (ref >59)

## 2020-11-29 MED ORDER — ERGOCALCIFEROL 1.25 MG (50000 UT) PO CAPS: 50000.0000 [IU] | ORAL_CAPSULE | ORAL | 1 refills | Status: AC

## 2020-11-29 NOTE — Telephone Encounter (Signed)
Pt called and said that he  Was suppose to get rx for antbotic at yesterday and  He never got  It , please advise

## 2020-11-29 NOTE — Progress Notes (Signed)
Meds ordered this encounter  Medications   ergocalciferol (VITAMIN D2) 1.25 MG (50000 UT) capsule    Sig: Take 1 capsule (50,000 Units total) by mouth once a week.    Dispense:  12 capsule    Refill:  1    Order Specific Question:   Supervising Provider    Answer:   JEGEDE, OLUGBEMIGA E [[id number]]   Camara Rosander Moore Rolin Schult  APRN, MSN, FNP-C [facility] [address]  [place], [place] [address] [phone]  

## 2020-11-29 NOTE — Telephone Encounter (Signed)
Charles Mcgee is a very pleasant 20 year old male with sickle cell disease that presented to clinic on 11/27/2020 for a post hospital follow up.   Patient does not warrant any further antibiotic therapy.  He completed azithromycin 500 mg for total of 3 days to complete his post hospital antibiotic regimen.  Patient will repeat chest x-ray in 4 weeks.  Reviewed all laboratory values, patient's WBCs have decreased to 17.7 from a 44 one week prior. Patient's hemoglobin is stable and consistent with his baseline. Vitamin D level is markedly decreased at 12.  We will send in Drisdol 50,000 IU/week.  Patient expressed understanding about current treatment plan.  He will follow-up in 3 months prior to moving to [place].   Nolon Nations  APRN, MSN, FNP-C [facility] [address] [place], [place] [address] [phone]

## 2020-12-17 ENCOUNTER — Telehealth: Payer: Self-pay

## 2020-12-17 NOTE — Telephone Encounter (Signed)
Pt called for covid results- pt was tested at [facility] 12/13/20. No covid results noted. Pt just graduated and will be traveling. Pt stated the link to his results reads Cannot verify account. Assisted pt by providing the number to [facility] and looking up [facility] lab.

## 2021-01-30 ENCOUNTER — Ambulatory Visit: Payer: Managed Care, Other (non HMO) | Admitting: Family Medicine

## 2021-03-27 ENCOUNTER — Ambulatory Visit: Payer: Managed Care, Other (non HMO) | Admitting: Family Medicine

## 2021-08-14 IMAGING — CT CT ANGIO CHEST
2 of 7 series · 18 of 46 positions shown · IV contrast (OMNIPAQUE)
Comparison: Chest radiograph November 21, 2020

CLINICAL DATA: Chest pain.  History of sickle cell disease

EXAM:
CT ANGIOGRAPHY CHEST WITH CONTRAST
TECHNIQUE: Multidetector CT imaging of the chest was performed using the
standard protocol during bolus administration of intravenous
contrast. Multiplanar CT image reconstructions and MIPs were
obtained to evaluate the vascular anatomy.
CONTRAST:  75 mL OMNIPAQUE IOHEXOL 350 MG/ML SOLN

[Series 6: thins · axial · 0.69mm/px · z∈[-329,-22]mm · 16 of 349 slices shown]
[im 21/349  lung]
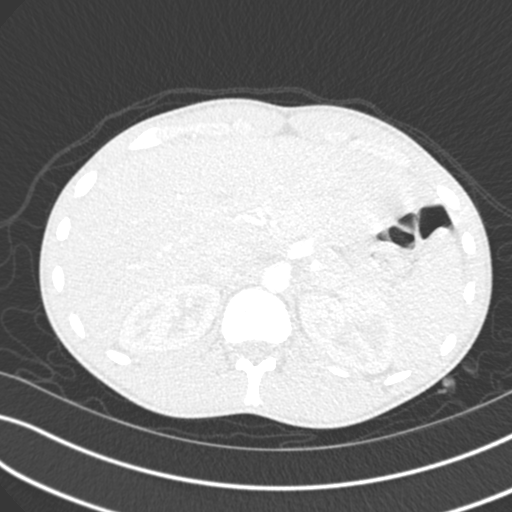
[im 41/349  soft-tissue]
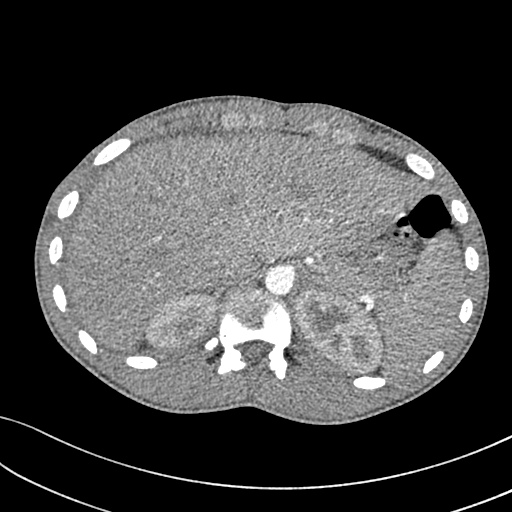
[im 62/349  lung]
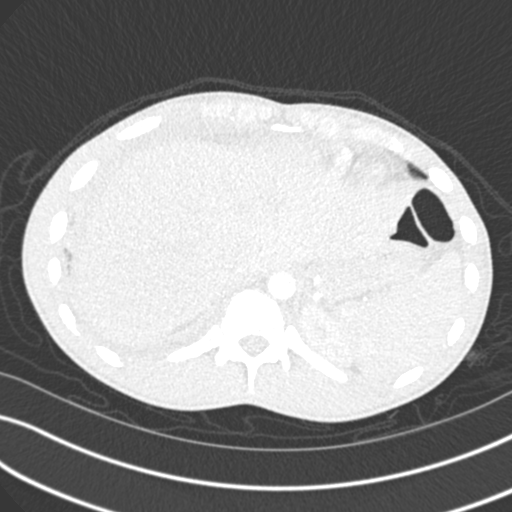
[im 82/349  soft-tissue]
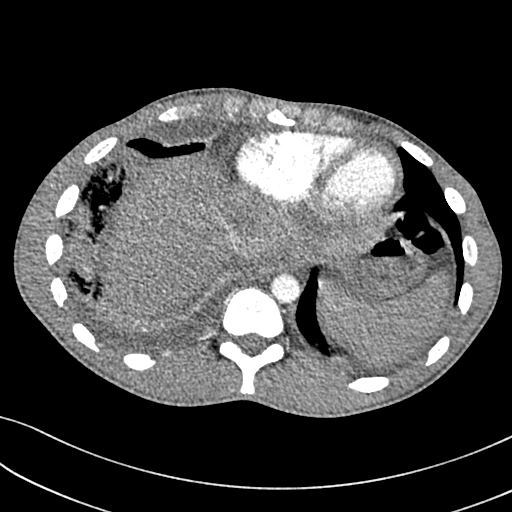
[im 103/349  lung]
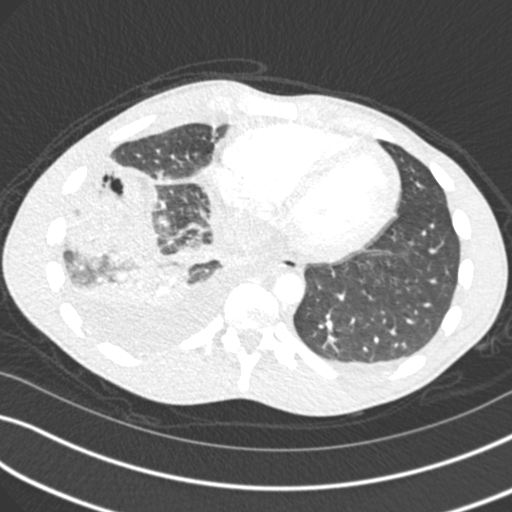
[im 123/349  soft-tissue]
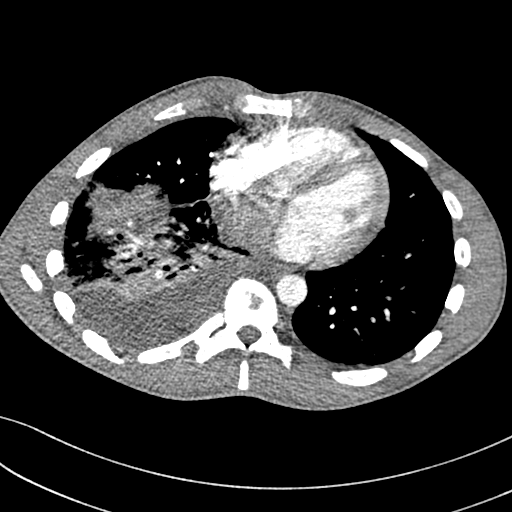
[im 144/349  lung]
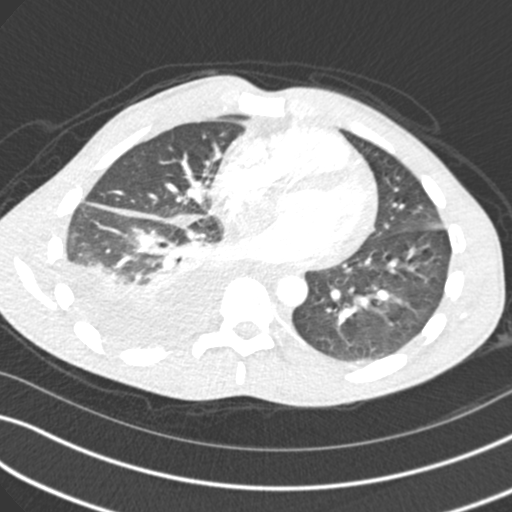
[im 164/349  soft-tissue]
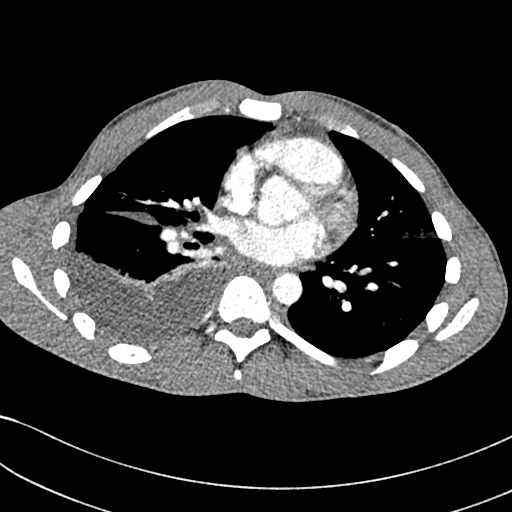
[im 185/349  lung]
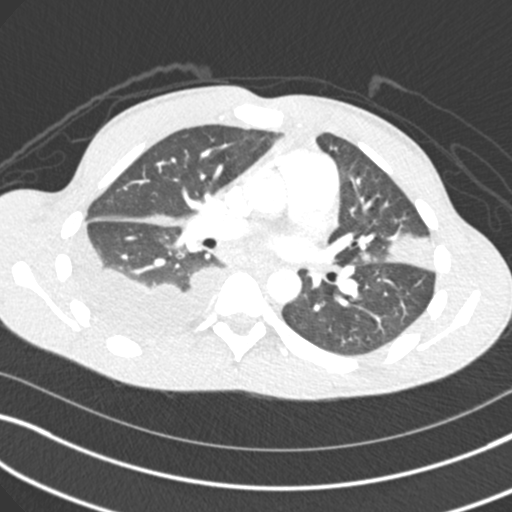
[im 205/349  soft-tissue]
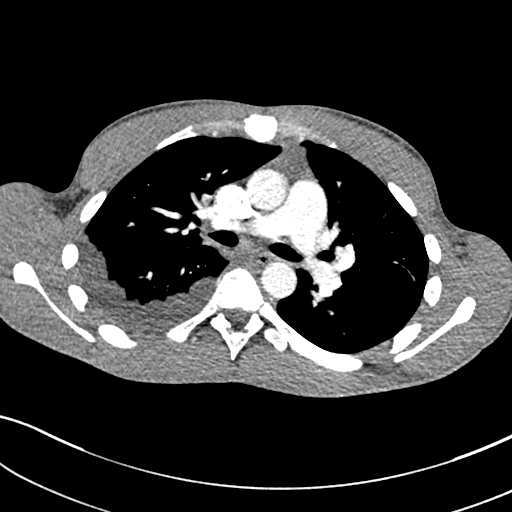
[im 226/349  lung]
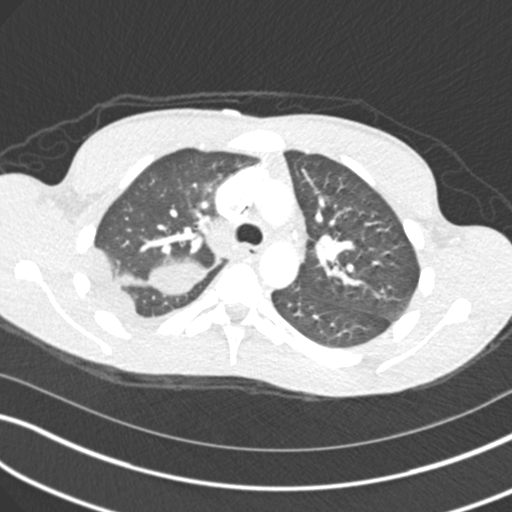
[im 246/349  soft-tissue]
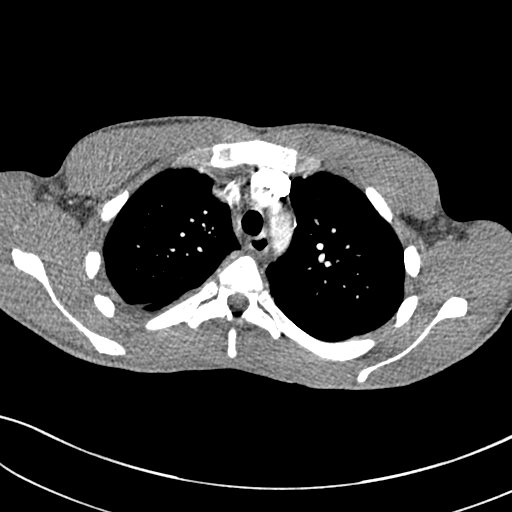
[im 267/349  lung]
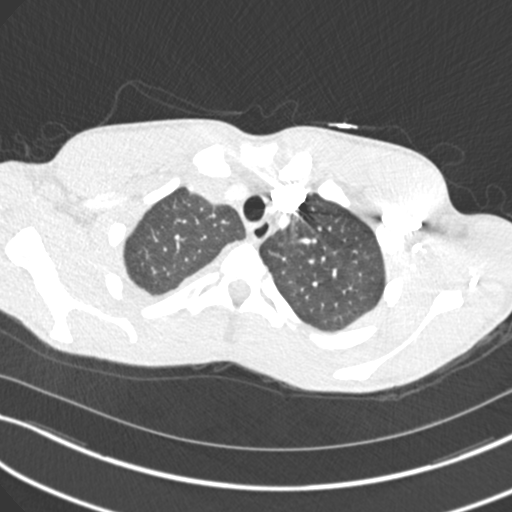
[im 287/349  soft-tissue]
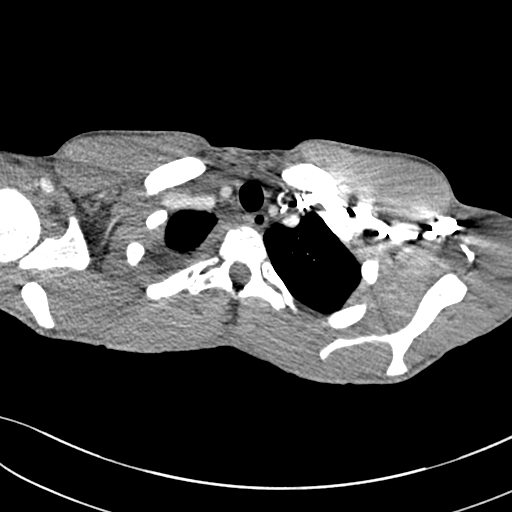
[im 308/349  lung]
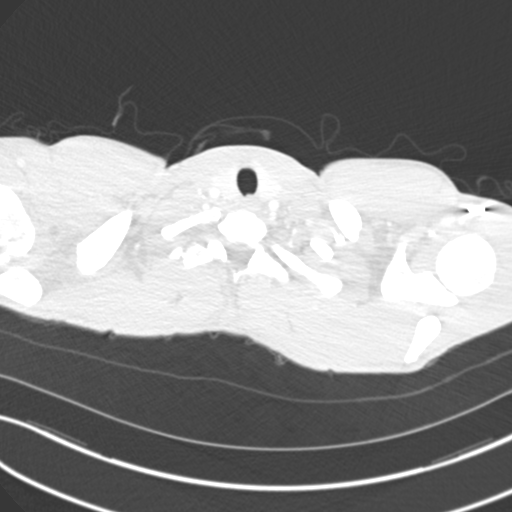
[im 328/349  soft-tissue]
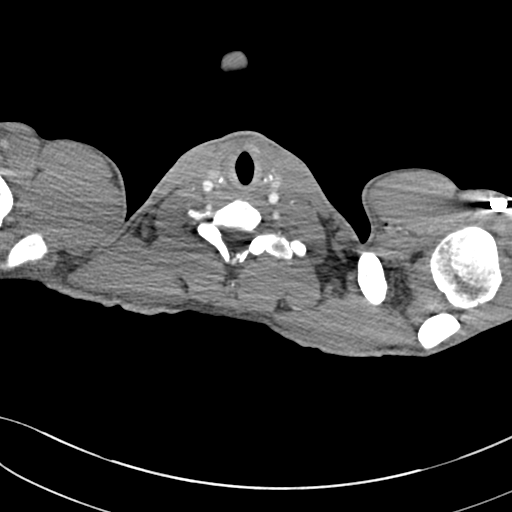

[Series 7: coronal mpr · coronal · 0.68mm/px · 2 of 79 slices shown]
[im 27/79  soft-tissue]
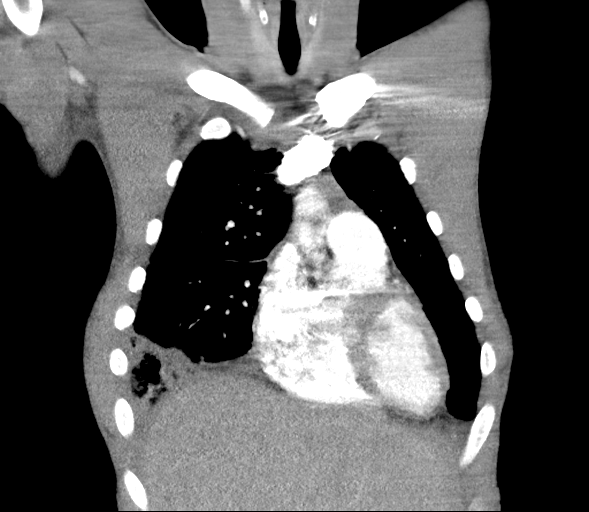
[im 53/79  soft-tissue]
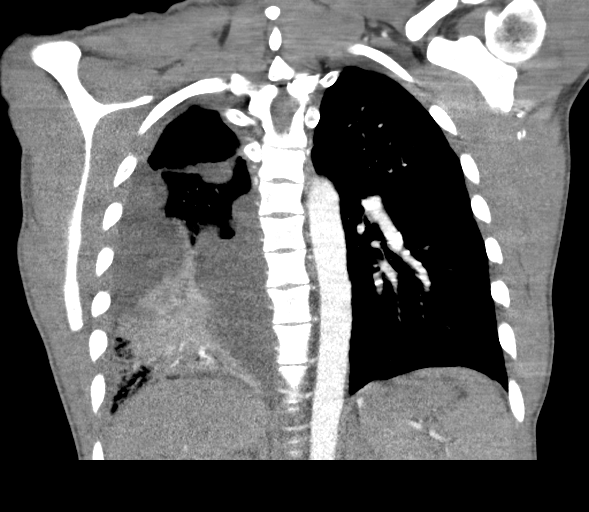

[18 of 46 positions shown; findings below may reference images not displayed]

FINDINGS: Cardiovascular: There is no demonstrable pulmonary embolus. There is
no thoracic aortic aneurysm or dissection. Visualized great vessels
appear unremarkable. There is no pericardial effusion or pericardial
thickening.

Mediastinum/Nodes: Thyroid appears normal. There is a lymph node in
the subcarinal region measuring 1.5 x 1.2 cm. No other adenopathy
evident in the thoracic region. No esophageal lesions are evident.

Lungs/Pleura: There is a sizable right pleural effusion with a much
smaller left pleural effusion. Fluid tracks along the right major
and minor fissures. There is extensive airspace opacity throughout
much of the right middle and lower lobes. There is also airspace
opacity in the posterior segment of the left upper lobe. There is
atelectatic change in the left base. The trachea and major bronchial
structures appear patent. No pneumothorax.

Upper Abdomen: There is incomplete visualization of an apparent
enlarged lymph node immediately to the right of the inferior vena
cava at the upper kidney level measuring 2.1 x 1.8 cm. Visualized
upper abdominal structures otherwise appear unremarkable.

Musculoskeletal: Bones are diffusely sclerotic consistent with known
sickle cell disease. There are several endplate infarcts in the
thoracic region. Evidence of a degree of avascular necrosis in the
right humeral head. No chest wall lesions.

Review of the MIP images confirms the above findings.
IMPRESSION: 1. No evident pulmonary embolus. No thoracic aortic aneurysm or
dissection.

2. Sizable right pleural effusion with small left pleural effusion.
Airspace opacity consistent with pneumonia throughout much of the
right middle and lower lobes. Pneumonia with localized consolidation
also noted in the posterior segment left upper lobe. There is left
base atelectasis.

3. Incomplete visualization of suspected enlarged retroperitoneal
adjacent to the inferior vena cava. Enlarged subcarinal lymph node
evident.

4.  Bony changes indicative of sickle cell disease.
